# Patient Record
Sex: Female | Born: 1980 | Race: Black or African American | Hispanic: No | Marital: Single | Smoking: Current every day smoker
Health system: Southern US, Community
[De-identification: ages and names within clinical notes are randomized; demographics above are authoritative.]

## PROBLEM LIST (undated history)

## (undated) ENCOUNTER — Emergency Department (HOSPITAL_COMMUNITY): Admission: EM | Payer: Medicaid Other | Source: Home / Self Care

---

## 2006-07-21 ENCOUNTER — Emergency Department (HOSPITAL_COMMUNITY): Admission: EM | Admit: 2006-07-21 | Discharge: 2006-07-21 | Payer: Self-pay | Admitting: *Deleted

## 2006-11-06 ENCOUNTER — Ambulatory Visit (HOSPITAL_COMMUNITY): Admission: RE | Admit: 2006-11-06 | Discharge: 2006-11-06 | Payer: Self-pay | Admitting: Obstetrics

## 2007-03-11 HISTORY — PX: TUBAL LIGATION: SHX77

## 2007-05-12 ENCOUNTER — Ambulatory Visit (HOSPITAL_COMMUNITY): Admission: RE | Admit: 2007-05-12 | Discharge: 2007-05-12 | Payer: Self-pay | Admitting: Obstetrics

## 2007-08-10 ENCOUNTER — Emergency Department (HOSPITAL_COMMUNITY): Admission: EM | Admit: 2007-08-10 | Discharge: 2007-08-10 | Payer: Self-pay | Admitting: Family Medicine

## 2007-09-28 ENCOUNTER — Inpatient Hospital Stay (HOSPITAL_COMMUNITY): Admission: AD | Admit: 2007-09-28 | Discharge: 2007-09-28 | Payer: Self-pay | Admitting: Obstetrics

## 2007-10-09 ENCOUNTER — Inpatient Hospital Stay (HOSPITAL_COMMUNITY): Admission: AD | Admit: 2007-10-09 | Discharge: 2007-10-11 | Payer: Self-pay | Admitting: Obstetrics

## 2007-10-10 ENCOUNTER — Encounter: Payer: Self-pay | Admitting: Obstetrics

## 2007-10-24 ENCOUNTER — Emergency Department (HOSPITAL_COMMUNITY): Admission: EM | Admit: 2007-10-24 | Discharge: 2007-10-24 | Payer: Self-pay | Admitting: Family Medicine

## 2009-06-20 ENCOUNTER — Emergency Department (HOSPITAL_COMMUNITY): Admission: EM | Admit: 2009-06-20 | Discharge: 2009-06-20 | Payer: Self-pay | Admitting: Family Medicine

## 2010-07-23 NOTE — Op Note (Signed)
Karina Fritz, Karina Fritz            ACCOUNT NO.:  [id number]   MEDICAL RECORD NO.:  [id number]          PATIENT TYPE:  INP   LOCATION:  9120                          FACILITY:  [facility]   PHYSICIAN:  Charles A. Clearance Coots, M.D.DATE OF BIRTH:  [birth year]   DATE OF PROCEDURE:  10/10/2007  DATE OF DISCHARGE:                               OPERATIVE REPORT   PREOPERATIVE DIAGNOSIS:  Desires sterilization.   POSTOPERATIVE DIAGNOSIS:  Desires sterilization.   PROCEDURE:  Bilateral partial salpingectomy.   SURGEON:  Charles A. Clearance Coots, MD   ANESTHESIA:  General.   ESTIMATED BLOOD LOSS:  Negligible.   COMPLICATIONS:  None.   SPECIMEN:  Approximately 2 cm segments of right and left fallopian  tubes.   DISPOSITION OF SPECIMEN:  Pathology.   OPERATION:  The patient was brought to the operating room and after  satisfactory general endotracheal anesthesia, the abdomen was prepped  and draped in the usual sterile fashion.  The urinary bladder was  emptied of approximately 200 mL of clear urine.  A small inferior  umbilical incision was made with a scalpel down to the fascia.  The  fascia was grasped in the midline with the Kelly forceps and was cut  transversely with curved Mayo scissors.  The fascial incision was  extended to left and to right with the curved Mayo scissors.  The  peritoneum was then entered bluntly with hemostats.  Right-angle  retractors were placed in the peritoneal opening.  The right fallopian  tube was identified and was grasped with a Babcock clamp.  The knuckle  of tube beneath the Babcock clamp was identified from the corneal end to  the fimbrial end and grasped with a Babcock clamp.  Knuckle of tube  beneath the Babcock clamp in the isthmic area of the tube was then  suture ligated through the mesosalpinx with 0 plain catgut.  The knuckle  of tube above the knot was excised with Metzenbaum scissors and  submitted to pathology for evaluation.  There was no active bleeding  at  the conclusion of the procedure.  Same procedure was performed on the  opposite side without complications.  The abdomen was then closed as  follows; the peritoneum and the fascia was closed as one with a  continuous suture of 2-0 Vicryl.  Skin was closed with a continuous  subcuticular suture of  4-0 Vicryl.  Sterile bandage was applied to the incision closure.  Surgical technician indicated that all needle, sponge, and instrument  counts were correct x2.  The patient tolerated the procedure well and  was transported to the recovery room in satisfactory condition.      Charles A. Clearance Coots, M.D.  Electronically Signed     CAH/MEDQ  D:  10/10/2007  T:  10/10/2007  Job:  [id number]

## 2010-12-06 LAB — CBC
HCT: 29.5 — ABNORMAL LOW
Hemoglobin: 9 — ABNORMAL LOW
Hemoglobin: 9.6 — ABNORMAL LOW
MCHC: 31.9
Platelets: 249
RBC: 3.67 — ABNORMAL LOW
WBC: 10.5

## 2015-05-15 ENCOUNTER — Emergency Department (HOSPITAL_COMMUNITY)
Admission: EM | Admit: 2015-05-15 | Discharge: 2015-05-15 | Disposition: A | Discharge status: Home / Self Care | Payer: Medicaid Other | Source: Home / Self Care | Attending: Family Medicine | Admitting: Family Medicine

## 2015-05-15 ENCOUNTER — Encounter (HOSPITAL_COMMUNITY): Payer: Self-pay | Admitting: Emergency Medicine

## 2015-05-15 DIAGNOSIS — S0502XA Injury of conjunctiva and corneal abrasion without foreign body, left eye, initial encounter: Secondary | ICD-10-CM

## 2015-05-15 MED ORDER — FLUORESCEIN SODIUM 1 MG OP STRP
ORAL_STRIP | OPHTHALMIC | Status: AC
  Filled 2015-05-15: qty 1

## 2015-05-15 MED ORDER — HYDROCODONE-ACETAMINOPHEN 5-325 MG PO TABS
1.0000 | ORAL_TABLET | Freq: Once | ORAL | Status: AC
  Administered 2015-05-15: 1 via ORAL

## 2015-05-15 MED ORDER — TOBRAMYCIN 0.3 % OP SOLN: 1.0000 [drp] | OPHTHALMIC | Status: DC

## 2015-05-15 MED ORDER — TEMAZEPAM 30 MG PO CAPS: 30.0000 mg | ORAL_CAPSULE | Freq: Every evening | ORAL | Status: DC | PRN

## 2015-05-15 MED ORDER — HYDROCODONE-ACETAMINOPHEN 5-325 MG PO TABS
ORAL_TABLET | ORAL | Status: AC
  Filled 2015-05-15: qty 1

## 2015-05-15 MED ORDER — TETRACAINE HCL 0.5 % OP SOLN
OPHTHALMIC | Status: AC
  Filled 2015-05-15: qty 2

## 2015-05-15 NOTE — Discharge Instructions (Signed)

## 2015-05-15 NOTE — ED Provider Notes (Signed)
CSN: [id number]     Arrival date & time 05/15/15  1820 History   First MD Initiated Contact with Patient 05/15/15 1944     Chief Complaint  Patient presents with  . Facial Swelling   (Consider location/radiation/quality/duration/timing/severity/associated sxs/prior Treatment) HPI Comments: 35 year old female complaining of pain and swelling to the left eye. This started approximately 4 days ago when she had itching to the left eye and she continued to rub it. The following day he was mildly swollen, irritated and red. The ensuing couple of days the swelling persisted in the discomfort pain increased. She has occasional light clear watery drainage from the left eye. Denies vision changes.   History reviewed. No pertinent past medical history. History reviewed. No pertinent past surgical history. No family history on file. Social History  Substance Use Topics  . Smoking status: Current Every Day Smoker  . Smokeless tobacco: None  . Alcohol Use: Yes     Comment: ocassionally   OB History    No data available     Review of Systems  Constitutional: Negative.   HENT: Negative.   Eyes: Positive for pain, redness and itching. Negative for discharge and visual disturbance.  Respiratory: Negative.   Neurological: Negative.     Allergies  Review of patient's allergies indicates no known allergies.  Home Medications   Prior to Admission medications   Medication Sig Start Date End Date Taking? Authorizing Provider  temazepam (RESTORIL) 30 MG capsule Take 1 capsule (30 mg total) by mouth at bedtime as needed for sleep. 05/15/15   Hayden Rasmussenavid Kalee Mcclenathan, NP  tobramycin (TOBREX) 0.3 % ophthalmic solution Place 1 drop into the left eye every 4 (four) hours. X 5 days 05/15/15   Hayden Rasmussenavid Shonta Phillis, NP   Meds Ordered and Administered this Visit   Medications  HYDROcodone-acetaminophen (NORCO/VICODIN) 5-325 MG per tablet 1 tablet (not administered)    BP 118/79 mmHg  Pulse 78  Temp(Src) 98.6 F (37 C) (Oral)   Resp 16  SpO2 100%  LMP 05/01/2015 No data found.   Physical Exam  Constitutional: She is oriented to person, place, and time. She appears well-developed and well-nourished. No distress.  Eyes: EOM are normal. Pupils are equal, round, and reactive to light.  Conjunctival erythema and a lower lid swelling. Minor scleral injection. Anterior chamber is clear. EOMI intact. After application of tetracaine and forcing the eye was examined under blue light and there is a semi-circular uptake to the inferior cornea. No foreign body seen. No other defects seen. Left eye began irrigated with eyewash.  Neck: Normal range of motion. Neck supple.  Cardiovascular: Normal rate.   Pulmonary/Chest: Effort normal. No respiratory distress.  Musculoskeletal: She exhibits no edema.  Neurological: She is alert and oriented to person, place, and time. She exhibits normal muscle tone.  Skin: Skin is warm and dry.  Psychiatric: She has a normal mood and affect.  Nursing note and vitals reviewed.   ED Course  Procedures (including critical care time)  Labs Review Labs Reviewed - No data to display  Imaging Review No results found.   Visual Acuity Review  Right Eye Distance:   Left Eye Distance:   Bilateral Distance:    Right Eye Near:   Left Eye Near:    Bilateral Near:         MDM   1. Corneal abrasion, left, initial encounter    Meds ordered this encounter  Medications  . tobramycin (TOBREX) 0.3 % ophthalmic solution    Sig:  Place 1 drop into the left eye every 4 (four) hours. X 5 days    Dispense:  5 mL    Refill:  0    Order Specific Question:  Supervising Provider    Answer:  Linna Hoff [phone]  . HYDROcodone-acetaminophen (NORCO/VICODIN) 5-325 MG per tablet 1 tablet    Sig:   . temazepam (RESTORIL) 30 MG capsule    Sig: Take 1 capsule (30 mg total) by mouth at bedtime as needed for sleep.    Dispense:  1 capsule    Refill:  0    Order Specific Question:  Supervising  Provider    Answer:  Bradd Canary D [[id number]]   F/U with opthal tomorrow or following day if not improving or worse.    Hayden Rasmussen, NP 05/15/15 2036

## 2015-05-15 NOTE — ED Notes (Signed)
Pt here with left eye redness and swelling that started Saturday Denies drainage, blurred vision or dizziness Tried eye drops but not getting better

## 2015-05-16 ENCOUNTER — Telehealth (HOSPITAL_COMMUNITY): Payer: Self-pay | Admitting: Emergency Medicine

## 2015-05-16 NOTE — ED Notes (Signed)
Accessed record for patient phone call 

## 2015-07-18 ENCOUNTER — Emergency Department (HOSPITAL_COMMUNITY)
Admission: EM | Admit: 2015-07-18 | Discharge: 2015-07-18 | Disposition: A | Discharge status: Home / Self Care | Payer: Medicaid Other | Attending: Emergency Medicine | Admitting: Emergency Medicine

## 2015-07-18 ENCOUNTER — Encounter (HOSPITAL_COMMUNITY): Payer: Self-pay | Admitting: Nurse Practitioner

## 2015-07-18 DIAGNOSIS — N390 Urinary tract infection, site not specified: Secondary | ICD-10-CM | POA: Diagnosis not present

## 2015-07-18 DIAGNOSIS — R350 Frequency of micturition: Secondary | ICD-10-CM | POA: Diagnosis present

## 2015-07-18 DIAGNOSIS — F17200 Nicotine dependence, unspecified, uncomplicated: Secondary | ICD-10-CM | POA: Insufficient documentation

## 2015-07-18 DIAGNOSIS — Z3202 Encounter for pregnancy test, result negative: Secondary | ICD-10-CM | POA: Insufficient documentation

## 2015-07-18 DIAGNOSIS — R319 Hematuria, unspecified: Secondary | ICD-10-CM

## 2015-07-18 LAB — URINE MICROSCOPIC-ADD ON

## 2015-07-18 LAB — URINALYSIS, ROUTINE W REFLEX MICROSCOPIC
GLUCOSE, UA: NEGATIVE mg/dL
KETONES UR: 15 mg/dL — AB
Nitrite: POSITIVE — AB
PROTEIN: 30 mg/dL — AB
Specific Gravity, Urine: 1.023 (ref 1.005–1.030)
pH: 5.5 (ref 5.0–8.0)

## 2015-07-18 LAB — POC URINE PREG, ED: Preg Test, Ur: NEGATIVE

## 2015-07-18 MED ORDER — CEPHALEXIN 500 MG PO CAPS: 500.0000 mg | ORAL_CAPSULE | Freq: Four times a day (QID) | ORAL | Status: DC

## 2015-07-18 NOTE — ED Notes (Addendum)
Pt reports 1 week history of increased urinary frequency. Then Monday she began to have dysuria, urinary hesitancy, hematuria. She has been taking AZO since Monday with no improvement. She denies fevers, chills, n/v. She is alert and breathing easily

## 2015-07-18 NOTE — ED Provider Notes (Signed)
CSN: [id number]     Arrival date & time 07/18/15  1502 History  By signing my name below, I, Karina Fritz, attest that this documentation has been prepared under the direction and in the presence of Karina Fritz, [place]. Electronically Signed: Angelene GiovanniEmmanuella Fritz, ED Scribe. 07/18/2015. 4:25 PM.    Chief Complaint  Patient presents with  . Urinary Frequency   The history is provided by the patient. No language interpreter was used.   HPI Comments: Karina Fritz is a 35 y.o. female who presents to the Emergency Department complaining of gradually worsening moderate urinary frequency onset 2 days ago. Pt reports associated dysuria, hematuria, back pain, and suprapubic pressure. No alleviating factors noted. She states that she has tried OTC AZO with no significant relief. Pt denies any hx of UTI. She denies any fever, chills, or n/v.    History reviewed. No pertinent past medical history. History reviewed. No pertinent past surgical history. History reviewed. No pertinent family history. Social History  Substance Use Topics  . Smoking status: Current Every Day Smoker  . Smokeless tobacco: None  . Alcohol Use: Yes     Comment: ocassionally   OB History    No data available     Review of Systems  Constitutional: Negative for fever and chills.  Gastrointestinal: Positive for abdominal pain. Negative for nausea and vomiting.  Genitourinary: Positive for dysuria, frequency and hematuria.  Musculoskeletal: Positive for back pain.  All other systems reviewed and are negative.     Allergies  Review of patient's allergies indicates no known allergies.  Home Medications   Prior to Admission medications   Medication Sig Start Date End Date Taking? Authorizing Provider  temazepam (RESTORIL) 30 MG capsule Take 1 capsule (30 mg total) by mouth at bedtime as needed for sleep. 05/15/15   Hayden Rasmussenavid Mabe, NP  tobramycin (TOBREX) 0.3 % ophthalmic solution Place 1 drop into the left eye every 4  (four) hours. X 5 days 05/15/15   Hayden Rasmussenavid Mabe, NP   BP 104/57 mmHg  Pulse 82  Temp(Src) 99.2 F (37.3 C) (Oral)  Resp 18  Ht 4\' 11"  (1.499 m)  Wt 125 lb (56.7 kg)  BMI 25.23 kg/m2  SpO2 100%  LMP 06/10/2015 Physical Exam  Constitutional: She is oriented to person, place, and time. She appears well-developed and well-nourished.  HENT:  Head: Normocephalic and atraumatic.  Eyes: Conjunctivae are normal.  Neck: Neck supple.  Cardiovascular: Normal rate.   Pulmonary/Chest: Effort normal.  Abdominal: Soft. Bowel sounds are normal. She exhibits no distension. There is no tenderness.  No CVA tenderness  Neurological: She is alert and oriented to person, place, and time.  Skin: Skin is warm and dry.  Psychiatric: She has a normal mood and affect.  Nursing note and vitals reviewed.   ED Course  Procedures (including critical care time) DIAGNOSTIC STUDIES: Oxygen Saturation is 100% on RA, normal by my interpretation.    COORDINATION OF CARE: 4:24 PM- Pt advised of plan for treatment and pt agrees. Pt will receive UA for further evaluation.    Labs Review Labs Reviewed  URINALYSIS, ROUTINE W REFLEX MICROSCOPIC (NOT AT [facility]) - Abnormal; Notable for the following:    Color, Urine AMBER (*)    APPearance HAZY (*)    Hgb urine dipstick LARGE (*)    Bilirubin Urine SMALL (*)    Ketones, ur 15 (*)    Protein, ur 30 (*)    Nitrite POSITIVE (*)    Leukocytes, UA MODERATE (*)  All other components within normal limits  URINE MICROSCOPIC-ADD ON - Abnormal; Notable for the following:    Squamous Epithelial / LPF 6-30 (*)    Bacteria, UA FEW (*)    All other components within normal limits  POC URINE PREG, ED    Imaging Review No results found.   Noelle Penner, PA-C has personally reviewed and evaluated these images and lab results as part of her medical decision-making.   MDM   Final diagnoses:  Urinary tract infection with hematuria, site unspecified    UA does show evidence  of contamination. However, it is nitrite positive with 6-30 WBC, TNTC hgb. Given symptoms will treat as UTI. Urine sent for culture. Started on keflex. Encouraged continuing Azo as needed for dysuria. ER return precautions given.   I personally performed the services described in this documentation, which was scribed in my presence. The recorded information has been reviewed and is accurate.   Carlene Coria, PA-C 07/18/15 1912  Pricilla Loveless, MD 07/18/15 2352

## 2015-07-18 NOTE — Discharge Instructions (Signed)
Your urine today showed evidence of infection. I will give you a prescription for antibiotics to take for one week. Please make sure to finish the entire bottle as prescribed. You may continue taking Azo to help with the pain if you would like. Return to the ER for new or worsening symptoms.   Urinary Tract Infection Urinary tract infections (UTIs) can develop anywhere along your urinary tract. Your urinary tract is your body's drainage system for removing wastes and extra water. Your urinary tract includes two kidneys, two ureters, a bladder, and a urethra. Your kidneys are a pair of bean-shaped organs. Each kidney is about the size of your fist. They are located below your ribs, one on each side of your spine. CAUSES Infections are caused by microbes, which are microscopic organisms, including fungi, viruses, and bacteria. These organisms are so small that they can only be seen through a microscope. Bacteria are the microbes that most commonly cause UTIs. SYMPTOMS  Symptoms of UTIs may vary by age and gender of the patient and by the location of the infection. Symptoms in young women typically include a frequent and intense urge to urinate and a painful, burning feeling in the bladder or urethra during urination. Older women and men are more likely to be tired, shaky, and weak and have muscle aches and abdominal pain. A fever may mean the infection is in your kidneys. Other symptoms of a kidney infection include pain in your back or sides below the ribs, nausea, and vomiting. DIAGNOSIS To diagnose a UTI, your caregiver will ask you about your symptoms. Your caregiver will also ask you to provide a urine sample. The urine sample will be tested for bacteria and white blood cells. White blood cells are made by your body to help fight infection. TREATMENT  Typically, UTIs can be treated with medication. Because most UTIs are caused by a bacterial infection, they usually can be treated with the use of  antibiotics. The choice of antibiotic and length of treatment depend on your symptoms and the type of bacteria causing your infection. HOME CARE INSTRUCTIONS  If you were prescribed antibiotics, take them exactly as your caregiver instructs you. Finish the medication even if you feel better after you have only taken some of the medication.  Drink enough water and fluids to keep your urine clear or pale yellow.  Avoid caffeine, tea, and carbonated beverages. They tend to irritate your bladder.  Empty your bladder often. Avoid holding urine for long periods of time.  Empty your bladder before and after sexual intercourse.  After a bowel movement, women should cleanse from front to back. Use each tissue only once. SEEK MEDICAL CARE IF:   You have back pain.  You develop a fever.  Your symptoms do not begin to resolve within 3 days. SEEK IMMEDIATE MEDICAL CARE IF:   You have severe back pain or lower abdominal pain.  You develop chills.  You have nausea or vomiting.  You have continued burning or discomfort with urination. MAKE SURE YOU:   Understand these instructions.  Will watch your condition.  Will get help right away if you are not doing well or get worse.   This information is not intended to replace advice given to you by your health care provider. Make sure you discuss any questions you have with your health care provider.   Document Released: 12/04/2004 Document Revised: 11/15/2014 Document Reviewed: 04/04/2011 Elsevier Interactive Patient Education [employer]2016 Elsevier Inc.

## 2016-04-02 ENCOUNTER — Emergency Department (HOSPITAL_COMMUNITY)
Admission: EM | Admit: 2016-04-02 | Discharge: 2016-04-02 | Disposition: A | Discharge status: Home / Self Care | Payer: Medicaid Other | Attending: Emergency Medicine | Admitting: Emergency Medicine

## 2016-04-02 ENCOUNTER — Encounter (HOSPITAL_COMMUNITY): Payer: Self-pay

## 2016-04-02 DIAGNOSIS — F17200 Nicotine dependence, unspecified, uncomplicated: Secondary | ICD-10-CM | POA: Insufficient documentation

## 2016-04-02 DIAGNOSIS — N3001 Acute cystitis with hematuria: Secondary | ICD-10-CM | POA: Insufficient documentation

## 2016-04-02 LAB — URINALYSIS, ROUTINE W REFLEX MICROSCOPIC
BILIRUBIN URINE: NEGATIVE
Glucose, UA: NEGATIVE mg/dL
KETONES UR: NEGATIVE mg/dL
Nitrite: POSITIVE — AB
PH: 5 (ref 5.0–8.0)
Protein, ur: 100 mg/dL — AB
Specific Gravity, Urine: 1.012 (ref 1.005–1.030)

## 2016-04-02 LAB — POC URINE PREG, ED: PREG TEST UR: NEGATIVE

## 2016-04-02 MED ORDER — FOSFOMYCIN TROMETHAMINE 3 G PO PACK
3.0000 g | PACK | Freq: Once | ORAL | Status: AC
  Administered 2016-04-02: 3 g via ORAL
  Filled 2016-04-02: qty 3

## 2016-04-02 MED ORDER — PHENAZOPYRIDINE HCL 200 MG PO TABS: 200.0000 mg | ORAL_TABLET | Freq: Three times a day (TID) | ORAL | 0 refills | Status: DC

## 2016-04-02 NOTE — ED Provider Notes (Signed)
[facility]-EMERGENCY DEPT Provider Note   CSN: [id number] Arrival date & time: 04/02/16  [id number]  By signing my name below, I, Modena JanskyAlbert Thayil, attest that this documentation has been prepared under the direction and in the presence of non-physician practitioner, Sharilyn SitesLisa Arloa Prak, PA-C. Electronically Signed: Modena JanskyAlbert Thayil, Scribe. 04/02/2016. 9:50 AM.  History   Chief Complaint Chief Complaint  Patient presents with  . Dysuria   The history is provided by the patient. No language interpreter was used.   HPI Comments: Karina Blanksomeka Fritz is a 36 y.o. female with a PMHx of UTI who presents to the Emergency Department complaining of intermittent moderate dysuria that started yesterday morning. She states she has been having a burning sensation with urination. She describes the current episode as similar to prior UTI. No modifying factors. She reports associated urinary symptoms of frequency and pressure. She denies any fever, vomiting, or other complaints. No vaginal discharge or pelvic pain. No flank pain or fever.  History reviewed. No pertinent past medical history.  There are no active problems to display for this patient.   History reviewed. No pertinent surgical history.  OB History    No data available       Home Medications    Prior to Admission medications   Medication Sig Start Date End Date Taking? Authorizing Provider  cephALEXin (KEFLEX) 500 MG capsule Take 1 capsule (500 mg total) by mouth 4 (four) times daily. 07/18/15   Ace GinsSerena Y Sam, PA-C  temazepam (RESTORIL) 30 MG capsule Take 1 capsule (30 mg total) by mouth at bedtime as needed for sleep. 05/15/15   Hayden Rasmussenavid Mabe, NP  tobramycin (TOBREX) 0.3 % ophthalmic solution Place 1 drop into the left eye every 4 (four) hours. X 5 days 05/15/15   Hayden Rasmussenavid Mabe, NP    Family History No family history on file.  Social History Social History  Substance Use Topics  . Smoking status: Current Every Day Smoker  . Smokeless tobacco: Not on file  .  Alcohol use Yes     Comment: ocassionally     Allergies   Patient has no known allergies.   Review of Systems Review of Systems  Constitutional: Negative for fever.  Gastrointestinal: Negative for vomiting.  Genitourinary: Positive for dysuria and frequency.       +pressure  All other systems reviewed and are negative.    Physical Exam Updated Vital Signs BP (!) 101/52   Pulse 78   Temp 98.9 F (37.2 C) (Oral)   Resp 18   SpO2 97%   Physical Exam  Constitutional: She is oriented to person, place, and time. She appears well-developed and well-nourished.  HENT:  Head: Normocephalic and atraumatic.  Mouth/Throat: Oropharynx is clear and moist.  Eyes: Conjunctivae and EOM are normal. Pupils are equal, round, and reactive to light.  Neck: Normal range of motion.  Cardiovascular: Normal rate, regular rhythm and normal heart sounds.   Pulmonary/Chest: Effort normal and breath sounds normal.  Abdominal: Soft. Bowel sounds are normal. There is no tenderness. There is no rebound and no CVA tenderness.  Soft, benign, no CVA tenderness  Musculoskeletal: Normal range of motion.  Neurological: She is alert and oriented to person, place, and time.  Skin: Skin is warm and dry.  Psychiatric: She has a normal mood and affect.  Nursing note and vitals reviewed.    ED Treatments / Results  DIAGNOSTIC STUDIES: Oxygen Saturation is 97% on RA, normal by my interpretation.    COORDINATION OF CARE: 9:54 AM- Pt  advised of plan for treatment and pt agrees.  Labs (all labs ordered are listed, but only abnormal results are displayed) Labs Reviewed  URINALYSIS, ROUTINE W REFLEX MICROSCOPIC - Abnormal; Notable for the following:       Result Value   Color, Urine AMBER (*)    APPearance HAZY (*)    Hgb urine dipstick MODERATE (*)    Protein, ur 100 (*)    Nitrite POSITIVE (*)    Leukocytes, UA TRACE (*)    Bacteria, UA RARE (*)    Squamous Epithelial / LPF 0-5 (*)    Non Squamous  Epithelial 0-5 (*)    All other components within normal limits  POC URINE PREG, ED    EKG  EKG Interpretation None       Radiology No results found.  Procedures Procedures (including critical care time)  Medications Ordered in ED Medications - No data to display   Initial Impression / Assessment and Plan / ED Course  I have reviewed the triage vital signs and the nursing notes.  Pertinent labs & imaging results that were available during my care of the patient were reviewed by me and considered in my medical decision making (see chart for details).  36 year old female here with dysuria and urinary frequency. History of UTI in the past with similar symptoms. She is afebrile and nontoxic. Abdomen is soft and benign. No CVA tenderness. UA appears infectious. No signs/symptoms concerning for pyelonephritis or infected stone.  She was treated with one-time dose of fosfomycin here, discharged home with Pyridium. Recommended to follow-up with PCP.  Discussed plan with patient, he/she acknowledged understanding and agreed with plan of care.  Return precautions given for new or worsening symptoms.  Final Clinical Impressions(s) / ED Diagnoses   Final diagnoses:  Acute cystitis with hematuria    New Prescriptions Discharge Medication List as of 04/02/2016 11:34 AM    START taking these medications   Details  phenazopyridine (PYRIDIUM) 200 MG tablet Take 1 tablet (200 mg total) by mouth 3 (three) times daily., Starting Wed 04/02/2016, Print       I personally performed the services described in this documentation, which was scribed in my presence. The recorded information has been reviewed and is accurate.    Garlon Hatchet, PA-C 04/02/16 1227    Gerhard Munch, MD 04/02/16 [phone]

## 2016-04-02 NOTE — ED Notes (Signed)
Pt is in stable condition upon d/c and ambulates from ED. 

## 2016-04-02 NOTE — Discharge Instructions (Signed)
You have been treated for UTI here. Take the prescribed medication as directed.  This will turn your urine red/orange which is normal. Follow-up with your primary care doctor. Return to the ED for new or worsening symptoms.

## 2016-04-02 NOTE — ED Notes (Signed)
Called mini lab to see why results haven't posted. Karina Fritz states they never received sample.

## 2016-04-02 NOTE — ED Triage Notes (Signed)
Patient complains of dysuria and urinary frequency x 1 day, hx of same. Denies pelvic pain, denies discharge

## 2017-05-04 ENCOUNTER — Ambulatory Visit: Payer: Self-pay | Admitting: Internal Medicine

## 2017-05-29 ENCOUNTER — Encounter: Payer: Self-pay | Admitting: Internal Medicine

## 2017-05-29 ENCOUNTER — Ambulatory Visit: Payer: Medicaid Other | Admitting: Internal Medicine

## 2017-05-29 VITALS — BP 100/64 | HR 74 | Resp 12 | Ht <= 58 in | Wt 141.5 lb

## 2017-05-29 DIAGNOSIS — Z716 Tobacco abuse counseling: Secondary | ICD-10-CM | POA: Diagnosis not present

## 2017-05-29 DIAGNOSIS — H547 Unspecified visual loss: Secondary | ICD-10-CM | POA: Diagnosis not present

## 2017-05-29 DIAGNOSIS — Z9189 Other specified personal risk factors, not elsewhere classified: Secondary | ICD-10-CM | POA: Diagnosis not present

## 2017-05-29 DIAGNOSIS — H6123 Impacted cerumen, bilateral: Secondary | ICD-10-CM | POA: Diagnosis not present

## 2017-05-29 NOTE — Progress Notes (Signed)
Subjective:    Patient ID: Karina Fritz, female    DOB: Jul 28, 1980, 37 y.o.   MRN: [id number]  HPI   Here to establish  1.  Dental concerns:  Just has not been to dentist in a long time.  Really just wants attention to her teeth.  2. Wears glasses for myopia.  Last check was 1 year ago and does not feel her glasses are correcting her vision adequately.  3.  Tobacco Use:  Has only tried to quit cold [place]. Stress draws her back.  No one else smokes in the home.  Many of friends smokes.   Children, ages 75, 69, 81 yo live with her and none of them smoke.  Current Meds  Medication Sig  . Biotin 5000 MCG CAPS Take by mouth.    No Known Allergies   No past medical history on file.   Past Surgical History:  Procedure Laterality Date  . TUBAL LIGATION  2009   Family History  Problem Relation Age of Onset  . Hyperlipidemia Mother   . Alcohol abuse Father        Alcoholic cirrhosis was cause of death  . Cerebral palsy Son        spastic hemiplegia, just affecting right leg   Social History   Socioeconomic History  . Marital status: Single    Spouse name: Not on file  . Number of children: Not on file  . Years of education: Not on file  . Highest education level: Not on file  Occupational History  . Not on file  Social Needs  . Financial resource strain: Not on file  . Food insecurity:    Worry: Never true    Inability: Never true  . Transportation needs:    Medical: No    Non-medical: No  Tobacco Use  . Smoking status: Current Every Day Smoker    Packs/day: 1.00    Years: 25.00    Pack years: 25.00    Types: Cigarettes    Start date: 09/28/1992  . Smokeless tobacco: Never Used  Substance and Sexual Activity  . Alcohol use: Yes    Comment: occasionally on weekends  . Drug use: No  . Sexual activity: Yes    Birth control/protection: Surgical  Lifestyle  . Physical activity:    Days per week: 7 days    Minutes per session: 20 min  . Stress: Very much    Relationships  . Social connections:    Talks on phone: More than three times a week    Gets together: More than three times a week    Attends religious service: Not on file    Active member of club or organization: Not on file    Attends meetings of clubs or organizations: Not on file    Relationship status: Not on file  . Intimate partner violence:    Fear of current or ex partner: No    Emotionally abused: No    Physically abused: No    Forced sexual activity: No  Other Topics Concern  . Not on file  Social History Narrative  . Not on file     Review of Systems     Objective:   Physical Exam  NAD HEENT:  PERRL, EOMI, discs sharp bilaterally.  TMs not visualized as has bilateral dark impacted cerumen.  Throat without injection.  No significant dental decay. Neck:  Supple, No adenopathy, no thyromegaly Chest:  CTA CV:  RRR with Normal S1 and  S2, No S3, S4 or murmur.  Radial and DP pulses normal and equal. Hot pink uggs      Assessment & Plan:  1.  Dental care:  Dental referral, but explained a delay currently as renovation ongoing with dental clinic.  2.  Myopia:  Optometry referral, but similar concerns for delayed referral as clinic merging.  3.  Tobacco abuse:  Discussed options at length. Patient to let me know if wants to do more than nicotine gum or patches.  Discussed Chantix/Wellbutrin options as well.  4.  Cerumen impaction:  To return next available for removal.  Discussed ear drops to soften prior.  5.  HM:  CPE with pap after follows up for fasting labs in 3-4 months.

## 2017-05-29 NOTE — Patient Instructions (Addendum)
Can google "advance directives, [place]"  And bring up form from [place]. Print and fill out Or can go to "5 wishes"  Which is also in Spanish and fill out--this costs $5--perhaps easier to use. Designate a Medical Power of Attorney to speak for you if you are unable to speak for yourself when ill or injured  Tobacco Cessation:   1800QUITNOW or [phone], the former for support and possibly free nicotine patches/gum and support; the latter for [facility] Smoking cessation class. Get rid of all smoking supplies:  Cigarettes, lighters, ashtrays--no stashes just in case at home if you are serious.  For Nicotine gum:  When you feel need for smoking:  Place nicotine gum in mouth and chew until juicy, then stick between gum and cheek.  You will absorb the nicotine from your mouth.   Do not aggressively chew gum. Spit gum out in garbage once you feel you are done with it.  For nicotine patches:  Stop smoking anything the day you start the first patch Start with 21 mg patch and reapply new to different area of skin every 24 hours for 28 days. Then 14 mg patch changed every 24 hours for 14 days. Then 7 mg patch changed every 24 hours for 14 days.  Also, we talked about Chantix and Wellbutrin/Bupropion which are pills for smoking cessation

## 2017-05-29 NOTE — Progress Notes (Signed)
Patient ID: Karina Fritz, female   DOB: 02/24/1981, 37 y.o.   MRN: [id number]  LCSW completed a new patient screener with pt. Patient reported that she feels depressed fairly often and that she has big mood swings -- "I go from 0 to 100 in a second." She reported that she has daily fatigue and stress. She reported no issues with basic needs/social determinants of health. She shared that she is interested in counseling to address her mental health symptoms but wants to think about it. LCSW normalized treating mental health and physical health together in a holistic approach, and left contact number.

## 2017-07-04 ENCOUNTER — Encounter: Payer: Self-pay | Admitting: Internal Medicine

## 2017-08-11 ENCOUNTER — Other Ambulatory Visit: Payer: Medicaid Other

## 2017-08-14 ENCOUNTER — Encounter: Payer: Medicaid Other | Admitting: Internal Medicine

## 2018-08-02 ENCOUNTER — Emergency Department (HOSPITAL_COMMUNITY)
Admission: EM | Admit: 2018-08-02 | Discharge: 2018-08-02 | Disposition: A | Discharge status: Home / Self Care | Payer: Self-pay | Attending: Emergency Medicine | Admitting: Emergency Medicine

## 2018-08-02 ENCOUNTER — Other Ambulatory Visit: Payer: Self-pay

## 2018-08-02 ENCOUNTER — Encounter (HOSPITAL_COMMUNITY): Payer: Self-pay | Admitting: Emergency Medicine

## 2018-08-02 DIAGNOSIS — F17210 Nicotine dependence, cigarettes, uncomplicated: Secondary | ICD-10-CM | POA: Insufficient documentation

## 2018-08-02 DIAGNOSIS — R21 Rash and other nonspecific skin eruption: Secondary | ICD-10-CM | POA: Insufficient documentation

## 2018-08-02 MED ORDER — CLOTRIMAZOLE 1 % EX CREA: TOPICAL_CREAM | CUTANEOUS | 0 refills | Status: DC

## 2018-08-02 NOTE — ED Triage Notes (Signed)
Pt c/o rash that started last week. Reports rash keeps spreading even after using a cream on it. reports itches. Thinks it is ringworm  [employer].

## 2018-08-02 NOTE — Discharge Instructions (Signed)
Please call a dermatologist for follow up °

## 2018-08-08 NOTE — ED Provider Notes (Signed)
[facility]-EMERGENCY DEPT Provider Note   CSN: [id number] Arrival date & time: 08/02/18  1029    History   Chief Complaint Chief Complaint  Patient presents with  . Rash    HPI Karina Blanksomeka Cardy is a 38 y.o. female.     HPI 38 year old female presents the emergency department with continued rash over the past several weeks despite miconazole cream.  She believes it could be secondary to ringworm.  She has not seen a primary care physician or dermatologist.  She presents the emergency department because she states it continues to move around her body and she is concerned.  No difficulty breathing or swallowing.  No fevers or chills.  No other systemic complaints.  Symptoms are mild in severity.  Denies new lotions or soaps or detergents.  No history of eczema or asthma per the patient   History reviewed. No pertinent past medical history.  There are no active problems to display for this patient.   Past Surgical History:  Procedure Laterality Date  . TUBAL LIGATION  2009     OB History   No obstetric history on file.      Home Medications    Prior to Admission medications   Medication Sig Start Date End Date Taking? Authorizing Provider  Biotin 5000 MCG CAPS Take by mouth.    [provider]  clotrimazole (LOTRIMIN) 1 % cream Apply to affected area 2 times daily 08/02/18   Azalia Bilisampos, Dejean Tribby, MD  temazepam (RESTORIL) 30 MG capsule Take 1 capsule (30 mg total) by mouth at bedtime as needed for sleep. Patient not taking: Reported on 05/29/2017 05/15/15   Hayden RasmussenMabe, David, NP  tobramycin (TOBREX) 0.3 % ophthalmic solution Place 1 drop into the left eye every 4 (four) hours. X 5 days Patient not taking: Reported on 05/29/2017 05/15/15   Hayden RasmussenMabe, David, NP    Family History Family History  Problem Relation Age of Onset  . Hyperlipidemia Mother   . Alcohol abuse Father        Alcoholic cirrhosis was cause of death  . Cerebral palsy Son        spastic  hemiplegia, just affecting right leg    Social History Social History   Tobacco Use  . Smoking status: Current Every Day Smoker    Packs/day: 1.00    Years: 25.00    Pack years: 25.00    Types: Cigarettes    Start date: 09/28/1992  . Smokeless tobacco: Never Used  Substance Use Topics  . Alcohol use: Yes    Comment: occasionally on weekends  . Drug use: No     Allergies   Patient has no known allergies.   Review of Systems Review of Systems  All other systems reviewed and are negative.    Physical Exam Updated Vital Signs BP 118/70 (BP Location: Left Arm)   Pulse 79   Temp 98.5 F (36.9 C) (Oral)   Resp 17   LMP 07/09/2018   SpO2 100%   Physical Exam Vitals signs and nursing note reviewed.  Constitutional:      Appearance: She is well-developed.  HENT:     Head: Normocephalic.  Neck:     Musculoskeletal: Normal range of motion.  Pulmonary:     Effort: Pulmonary effort is normal.  Abdominal:     General: There is no distension.  Musculoskeletal: Normal range of motion.  Skin:    Comments: Diffuse generalized macular rash.  There are some areas that are consistent with  ringworm on her back.  Neurological:     Mental Status: She is alert and oriented to person, place, and time.      ED Treatments / Results  Labs (all labs ordered are listed, but only abnormal results are displayed) Labs Reviewed - No data to display  EKG None  Radiology No results found.  Procedures Procedures (including critical care time)  Medications Ordered in ED Medications - No data to display   Initial Impression / Assessment and Plan / ED Course  I have reviewed the triage vital signs and the nursing notes.  Pertinent labs & imaging results that were available during my care of the patient were reviewed by me and considered in my medical decision making (see chart for details).        Recommend primary care and dermatology follow-up.  Will initiate on  clotrimazole cream.  No life-threatening emergency present.  This is not a life-threatening rash.  She is stable for discharge and continued outpatient follow-up.  Final Clinical Impressions(s) / ED Diagnoses   Final diagnoses:  Rash    ED Discharge Orders         Ordered    clotrimazole (LOTRIMIN) 1 % cream     08/02/18 1101           Azalia Bilis, MD 08/08/18 1153

## 2020-04-25 ENCOUNTER — Other Ambulatory Visit: Payer: Self-pay

## 2020-04-25 DIAGNOSIS — Z1231 Encounter for screening mammogram for malignant neoplasm of breast: Secondary | ICD-10-CM

## 2020-05-24 ENCOUNTER — Other Ambulatory Visit: Payer: Self-pay

## 2020-05-24 ENCOUNTER — Ambulatory Visit
Admission: RE | Admit: 2020-05-24 | Discharge: 2020-05-24 | Disposition: A | Discharge status: Home / Self Care | Payer: No Typology Code available for payment source | Source: Ambulatory Visit | Attending: Obstetrics and Gynecology | Admitting: Obstetrics and Gynecology

## 2020-05-24 ENCOUNTER — Ambulatory Visit: Payer: Self-pay | Admitting: *Deleted

## 2020-05-24 VITALS — BP 106/72 | Wt 158.2 lb

## 2020-05-24 DIAGNOSIS — Z01419 Encounter for gynecological examination (general) (routine) without abnormal findings: Secondary | ICD-10-CM

## 2020-05-24 DIAGNOSIS — Z1231 Encounter for screening mammogram for malignant neoplasm of breast: Secondary | ICD-10-CM

## 2020-05-24 DIAGNOSIS — N898 Other specified noninflammatory disorders of vagina: Secondary | ICD-10-CM

## 2020-05-24 NOTE — Patient Instructions (Signed)
Explained breast self awareness with Karina Fritz. Pap smear completed today. Let her know [facility]CP will cover Pap smears and HPV typing every 5 years unless has a history of abnormal Pap smears. Referred patient to the [facility] for a screening mammogram on the mobile unit. Appointment scheduled Thursday, May 24, 2020 at 1400. Patient aware of appointment and will be there. Let patient know will follow up with her within the next week with results of her Pap smear and wet prep by phone. Informed patient that the Breast Center will follow-up with her within the next couple of weeks with results of her mammogram by letter or phone. Discussed smoking cessation with patient. Referred to the [facility] Quitline and gave resources to the free smoking cessation classes at [facility]. Karina Fritz verbalized understanding.  Jarreau Callanan, Kathaleen Maser, RN 1:26 PM

## 2020-05-24 NOTE — Progress Notes (Signed)
Karina Fritz is a 40 y.o. No obstetric history on file. female who presents to [facility] clinic today with complaint of occasionally bilateral breast soreness x 2 years. Patient rates the pain at a 2 out of 10 when it occurs. Patient denies any pain today.    Pap Smear: Pap smear completed today. Last Pap smear was in August 2009 at [facility] [place] clinic and was normal per patient. Per patient has no history of an abnormal Pap smear. Last Pap smear result is not available in Epic.   Physical exam: Breasts Breasts symmetrical. No skin abnormalities bilateral breasts. No nipple retraction bilateral breasts. No nipple discharge bilateral breasts. No lymphadenopathy. No lumps palpated bilateral breasts. Complaints of diffuse bilateral breast tenderness on exam. Patient stated that her menstrual cycle is due soon.      Pelvic/Bimanual Ext Genitalia No lesions, no swelling and no discharge observed on external genitalia.        Vagina Vagina pink and normal texture. No lesions and thick white discharge observed in vagina. Wet prep completed.        Cervix Cervix is present. Cervix pink and of normal texture. Thick white discharge observed on cervix.   Uterus Uterus is present and palpable. Uterus in normal position and normal size.        Adnexae Bilateral ovaries present and palpable. No tenderness on palpation.         Rectovaginal No rectal exam completed today since patient had no rectal complaints. No skin abnormalities observed on exam.     Smoking History: Patient is a current smoker. Discussed smoking cessation with patient. Referred to the [facility] Quitline and gave resources to the free smoking cessation classes at [facility].    Patient Navigation: Patient education provided. Access to services provided for patient through BCCCP program.    Breast and Cervical Cancer Risk Assessment: Patient does not have family history of breast cancer,  known genetic mutations, or radiation treatment to the chest before age 66. Patient does not have history of cervical dysplasia, immunocompromised, or DES exposure in-utero.  Risk Assessment    Risk Scores      05/24/2020   Last edited by: Narda Rutherford, LPN   5-year risk: 0.6 %   Lifetime risk: 9.6 %          A: BCCCP exam with pap smear Complaint of occasional bilateral breast soreness.  P: Referred patient to the [facility] for a screening mammogram on the mobile unit. Appointment scheduled Thursday, May 24, 2020 at 1400.  Priscille Heidelberg, RN 05/24/2020 1:26 PM

## 2020-05-25 ENCOUNTER — Telehealth: Payer: Self-pay

## 2020-05-25 LAB — CERVICOVAGINAL ANCILLARY ONLY
Bacterial Vaginitis (gardnerella): POSITIVE — AB
Candida Glabrata: NEGATIVE
Candida Vaginitis: NEGATIVE
Comment: NEGATIVE
Comment: NEGATIVE
Comment: NEGATIVE
Comment: NEGATIVE
Trichomonas: NEGATIVE

## 2020-05-25 LAB — CYTOLOGY - PAP
Comment: NEGATIVE
Diagnosis: NEGATIVE
High risk HPV: NEGATIVE

## 2020-05-25 NOTE — Progress Notes (Signed)
Patient informed positive BV. Patient stated that this is her 3rd time having BV within a 2 month span. Each time patient was prescribed Metronidazole Gel. Patient completed last prescription 1 week ago, symptoms did not improve, still has odor, white discharge. Patient has difficulty swallowing pills and tolerating oral meds, but will do so if needed. Is oral Metronidazole safe to take after 3 prescriptions of Metrogel? [facility])

## 2020-05-25 NOTE — Telephone Encounter (Signed)
Attempted to contact patients regarding lab results (Wet prep). Left message on voicemail requesting return call.

## 2020-05-28 ENCOUNTER — Telehealth: Payer: Self-pay

## 2020-05-28 ENCOUNTER — Other Ambulatory Visit: Payer: Self-pay | Admitting: Obstetrics and Gynecology

## 2020-05-28 DIAGNOSIS — R928 Other abnormal and inconclusive findings on diagnostic imaging of breast: Secondary | ICD-10-CM

## 2020-05-28 MED ORDER — CLINDAMYCIN HCL 300 MG PO CAPS: 300.0000 mg | ORAL_CAPSULE | Freq: Two times a day (BID) | ORAL | 0 refills | Status: DC

## 2020-05-28 NOTE — Addendum Note (Signed)
Addended by: Catalina Antigua on: 05/28/2020 09:12 AM   Modules accepted: Orders

## 2020-05-28 NOTE — Telephone Encounter (Addendum)
Patient informed recommendations per Dr. Jolayne Panther, and negative Pap/HPV results. Patient also stated she received a message from the Breast Center stating she needed to return to the Breast Center for further imaging. Patient informed importance of follow-up diagnostic mammogram with possible ultrasounds. Patient informed that the physician/radiologists will discuss the results during appointment. Patient informed BCCCP policy and guidelines regarding coverage for diagnostic mammogram and ultrasound. Patient verbalized understanding.    ----- Message from Catalina Antigua, MD sent at 05/28/2020  9:12 AM EDT ----- Rx clindamycin e-prescribed. Clindamycin vaginal gel does not appear to be covered by her insurance, but that would be another option

## 2020-06-14 ENCOUNTER — Other Ambulatory Visit: Payer: Self-pay

## 2020-06-14 ENCOUNTER — Ambulatory Visit
Admission: RE | Admit: 2020-06-14 | Discharge: 2020-06-14 | Disposition: A | Discharge status: Home / Self Care | Payer: BLUE CROSS/BLUE SHIELD | Source: Ambulatory Visit | Attending: Obstetrics and Gynecology | Admitting: Obstetrics and Gynecology

## 2020-06-14 DIAGNOSIS — R928 Other abnormal and inconclusive findings on diagnostic imaging of breast: Secondary | ICD-10-CM

## 2020-07-04 ENCOUNTER — Telehealth: Payer: Self-pay

## 2020-07-04 NOTE — Telephone Encounter (Signed)
Patient called and stated that she had pap smear/wet prep results, diagnosed with bacterial vaginitis. Patient stated the medication (clindamycin) did not relieve symptoms. She continues to have symptoms, seems to be ongoing, requests referral to gynecologist. Patient given contact information for [facility].Patient was also informed gynecology visit is not covered by [facility], can apply for [facility]. Patient verbalized understanding.

## 2020-07-17 ENCOUNTER — Ambulatory Visit (INDEPENDENT_AMBULATORY_CARE_PROVIDER_SITE_OTHER): Payer: Medicaid Other | Admitting: Obstetrics and Gynecology

## 2020-07-17 ENCOUNTER — Other Ambulatory Visit: Payer: Self-pay

## 2020-07-17 ENCOUNTER — Encounter: Payer: Self-pay | Admitting: Obstetrics and Gynecology

## 2020-07-17 VITALS — BP 101/68 | HR 71 | Ht <= 58 in | Wt 157.0 lb

## 2020-07-17 DIAGNOSIS — B9689 Other specified bacterial agents as the cause of diseases classified elsewhere: Secondary | ICD-10-CM

## 2020-07-17 DIAGNOSIS — N760 Acute vaginitis: Secondary | ICD-10-CM

## 2020-07-17 MED ORDER — METRONIDAZOLE 0.75 % VA GEL: 1.0000 | Freq: Every day | VAGINAL | 1 refills | Status: DC

## 2020-07-17 NOTE — Patient Instructions (Signed)
Bacterial Vaginosis  Bacterial vaginosis is an infection of the vagina. It happens when too many normal germs (healthy bacteria) grow in the vagina. This infection can make it easier to get other infections from sex (STIs). It is very important for pregnant women to get treated. This infection can cause babies to be born early or at a low birth weight. What are the causes? This infection is caused by an increase in certain germs that grow in the vagina. You cannot get this infection from toilet seats, bedsheets, swimming pools, or things that touch your vagina. What increases the risk?  Having sex with a new person or more than one person.  Having sex without protection.  Douching.  Having an intrauterine device (IUD).  Smoking.  Using drugs or drinking alcohol. These can lead you to do things that are risky.  Taking certain antibiotic medicines.  Being pregnant. What are the signs or symptoms? Some women have no symptoms. Symptoms may include:  A discharge from your vagina. It may be gray or white. It can be watery or foamy.  A fishy smell. This can happen after sex or during your menstrual period.  Itching in and around your vagina.  A feeling of burning or pain when you pee (urinate). How is this treated? This infection is treated with antibiotic medicines. These may be given to you as:  A pill.  A cream for your vagina.  A medicine that you put into your vagina (suppository). If the infection comes back after treatment, you may need more antibiotics. Follow these instructions at home: Medicines  Take over-the-counter and prescription medicines as told by your doctor.  Take or use your antibiotic medicine as told by your doctor. Do not stop taking or using it, even if you start to feel better. General instructions  If the person you have sex with is a woman, tell her that you have this infection. She will need to follow up with her doctor. If you have a female  partner, he does not need to be treated.  Do not have sex until you finish treatment.  Drink enough fluid to keep your pee pale yellow.  Keep your vagina and butt clean. ? Wash the area with warm water each day. ? Wipe from front to back after you use the toilet.  If you are breastfeeding a baby, ask your doctor if you should keep doing so during treatment.  Keep all follow-up visits. How is this prevented? Self-care  Do not douche.  Use only warm water to wash around your vagina.  Wear underwear that is cotton or lined with cotton.  Do not wear tight pants and pantyhose, especially in the summer. Safe sex  Use protection when you have sex. This includes: ? Use condoms. ? Use dental dams. This is a thin layer that protects the mouth during oral sex.  Limit how many people you have sex with. To prevent this infection, it is best to have sex with just one person.  Get tested for STIs. The person you have sex with should also get tested. Drugs and alcohol  Do not smoke or use any products that contain nicotine or tobacco. If you need help quitting, ask your doctor.  Do not use drugs.  Do not drink alcohol if: ? Your doctor tells you not to drink. ? You are pregnant, may be pregnant, or are planning to become pregnant.  If you drink alcohol: ? Limit how much you have to 0-1 drink   a day. ? Know how much alcohol is in your drink. In the [place] one drink equals one 12 oz bottle of beer (355 mL), one 5 oz glass of wine (148 mL), or one 1 oz glass of hard liquor (44 mL). Where to find more information  Centers for Disease Control and Prevention: www.cdc.gov  American Sexual Health Association: www.ashastd.org  Office on Women's Health: www.womenshealth.gov Contact a doctor if:  Your symptoms do not get better, even after you are treated.  You have more discharge or pain when you pee.  You have a fever or chills.  You have pain in your belly (abdomen) or in the area  between your hips.  You have pain with sex.  You bleed from your vagina between menstrual periods. Summary  This infection can happen when too many germs (bacteria) grow in the vagina.  This infection can make it easier to get infections from sex (STIs). Treating this can lower that chance.  Get treated if you are pregnant. This infection can cause babies to be born early.  Do not stop taking or using your antibiotic medicine, even if you start to feel better. This information is not intended to replace advice given to you by your health care provider. Make sure you discuss any questions you have with your health care provider. Document Revised: 08/25/2019 Document Reviewed: 08/25/2019 Elsevier Patient Education  2021 Elsevier Inc.  

## 2020-07-17 NOTE — Progress Notes (Signed)
   History:  [name] is a 40 y.o. No obstetric history on file. 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  Review of Systems:  Review of Systems  Constitutional: Negative for chills and fever.  Gastrointestinal: Negative for abdominal pain, constipation, diarrhea, nausea and vomiting.  Genitourinary: Negative for dysuria, flank pain, frequency and urgency.  Neurological: Negative for dizziness.      Objective:  Physical Exam BP 101/68   Pulse 71   Ht 4\' 10"  (1.473 m)   Wt 157 lb (71.2 kg)   LMP 07/16/2020   BMI 32.81 kg/m  Physical Exam Vitals and nursing note reviewed.  Constitutional:      Appearance: Normal appearance.  HENT:     Head: Normocephalic.  Cardiovascular:     Rate and Rhythm: Normal rate and regular rhythm.  Abdominal:     Palpations: Abdomen is soft.  Neurological:     Mental Status: She is alert.  Psychiatric:        Mood and Affect: Mood normal.        Behavior: Behavior normal.       Labs and Imaging No results found for this or any previous visit (from the past 24 hour(s)).  No results found.   Assessment & Plan:  1. Bacterial vaginosis -will treat with metrogel. Discussed preventative measures - avoiding douching, wearing loose clothing, probiotics, etc.  -going forward would recommend trial of longer term metrogel for recurrent BV. (ten day course followed by intermittent dosing).    09/15/2020, MD 07/17/2020 3:47 PM    09/16/2020, MD [facility] Fellow, [facility] for [facility], [facility]

## 2020-11-14 ENCOUNTER — Telehealth: Payer: Self-pay

## 2020-11-14 DIAGNOSIS — B9689 Other specified bacterial agents as the cause of diseases classified elsewhere: Secondary | ICD-10-CM

## 2020-11-14 DIAGNOSIS — N760 Acute vaginitis: Secondary | ICD-10-CM

## 2020-11-14 MED ORDER — METRONIDAZOLE 0.75 % VA GEL: 1.0000 | Freq: Two times a day (BID) | VAGINAL | 0 refills | Status: DC

## 2020-11-14 NOTE — Telephone Encounter (Signed)
Patient called and left voicemail on nurse line reporting symptoms of BV and requesting refill. Called pt. Pt reports white discharge with foul odor, states this feels like prior BV infections. Requests Metrogel; sent to patient's pharmacy. Pt states she gets BV often, starting to seem like it is after each period. Recommended pt try baking soda baths or tea trea oil tampons following menstrual period. Encouraged pt to call us as needed and to schedule annual visit March 2023.

## 2021-01-15 ENCOUNTER — Other Ambulatory Visit: Payer: Self-pay

## 2021-01-15 DIAGNOSIS — B9689 Other specified bacterial agents as the cause of diseases classified elsewhere: Secondary | ICD-10-CM

## 2021-01-17 ENCOUNTER — Ambulatory Visit (INDEPENDENT_AMBULATORY_CARE_PROVIDER_SITE_OTHER): Payer: Medicaid Other

## 2021-01-17 ENCOUNTER — Other Ambulatory Visit: Payer: Self-pay

## 2021-01-17 ENCOUNTER — Other Ambulatory Visit (HOSPITAL_COMMUNITY)
Admission: RE | Admit: 2021-01-17 | Discharge: 2021-01-17 | Disposition: A | Discharge status: Home / Self Care | Payer: Medicaid Other | Source: Ambulatory Visit | Attending: Family Medicine | Admitting: Family Medicine

## 2021-01-17 VITALS — BP 110/63 | HR 67 | Wt 148.9 lb

## 2021-01-17 DIAGNOSIS — N898 Other specified noninflammatory disorders of vagina: Secondary | ICD-10-CM | POA: Diagnosis present

## 2021-01-17 MED ORDER — METRONIDAZOLE 0.75 % VA GEL: 1.0000 | Freq: Two times a day (BID) | VAGINAL | 0 refills | Status: DC

## 2021-01-17 NOTE — Progress Notes (Signed)
Patient was assessed and managed by nursing staff during this encounter. I have reviewed the chart and agree with the documentation and plan.   Jaynie Collins, MD 01/17/2021 12:28 PM

## 2021-01-17 NOTE — Progress Notes (Signed)
Here today with complaint of white vaginal discharge with foul odor. Reports this is the same odor she has noticed with BV in the past. Reports history of recurrent BV. Last seen by provider 07/17/20. Self swab instructions given and specimen obtained. Explained I will send treatment for BV today and we will follow up with any abnormal results. Pt states she is unable to take Flagyl tablets, requests vaginal gel. I explained there is significant cost difference, pt would still like Metrogel sent to pharmacy.   Recommended pt schedule provider appt to discuss treatment for recurrent BV if patient does not feel current regimen is working.   Fleet Contras RN 01/17/21

## 2021-01-18 ENCOUNTER — Other Ambulatory Visit: Payer: Self-pay | Admitting: Obstetrics & Gynecology

## 2021-01-18 DIAGNOSIS — N760 Acute vaginitis: Secondary | ICD-10-CM

## 2021-01-18 LAB — CERVICOVAGINAL ANCILLARY ONLY
Bacterial Vaginitis (gardnerella): POSITIVE — AB
Candida Glabrata: NEGATIVE
Candida Vaginitis: NEGATIVE
Chlamydia: NEGATIVE
Comment: NEGATIVE
Comment: NEGATIVE
Comment: NEGATIVE
Comment: NEGATIVE
Comment: NEGATIVE
Comment: NORMAL
Neisseria Gonorrhea: NEGATIVE
Trichomonas: NEGATIVE

## 2021-01-18 MED ORDER — METRONIDAZOLE 0.75 % VA GEL: 1.0000 | Freq: Every day | VAGINAL | 5 refills | Status: DC

## 2021-02-04 ENCOUNTER — Other Ambulatory Visit: Payer: Self-pay | Admitting: Obstetrics & Gynecology

## 2021-02-04 DIAGNOSIS — N760 Acute vaginitis: Secondary | ICD-10-CM

## 2021-02-20 ENCOUNTER — Other Ambulatory Visit (HOSPITAL_COMMUNITY)
Admission: RE | Admit: 2021-02-20 | Discharge: 2021-02-20 | Disposition: A | Discharge status: Home / Self Care | Payer: Medicaid Other | Source: Ambulatory Visit | Attending: Certified Nurse Midwife | Admitting: Certified Nurse Midwife

## 2021-02-20 ENCOUNTER — Ambulatory Visit: Payer: Medicaid Other | Admitting: Certified Nurse Midwife

## 2021-02-20 ENCOUNTER — Other Ambulatory Visit: Payer: Self-pay

## 2021-02-20 VITALS — BP 120/74 | HR 92 | Wt 140.2 lb

## 2021-02-20 DIAGNOSIS — Z8742 Personal history of other diseases of the female genital tract: Secondary | ICD-10-CM | POA: Diagnosis not present

## 2021-02-20 DIAGNOSIS — Z202 Contact with and (suspected) exposure to infections with a predominantly sexual mode of transmission: Secondary | ICD-10-CM | POA: Diagnosis present

## 2021-02-20 NOTE — Patient Instructions (Signed)
At Home Vaginal BV Prevention: If engaging in intercourse without a barrier method, consider using semen sponges afterward. You can buy them at [employer] Begin taking a daily probiotic  Use a gentle antibacterial soap on your vulva (never in the vagina), such as Alaffia's Liquid Black Soap with Tea Tree. Avoid scented or harsh soaps. Use a "tea tree tampon" once a month: Melt 1/4c virgin coconut oil, add 1-2 drops of quality tea tree oil.  Soak a tampon in the mixture  Insert overnight the day after your period has ended. 5.   Place a 30mg boric acid tablet vaginally every night for 21 nights, then weekly for        9 weeks.  

## 2021-02-20 NOTE — Progress Notes (Signed)
History:  [name] is a 40 y.o. nulliparous patient who presents to clinic today for possible STI exposure and recurrent BV for the past two months. A former sexual partner divulged that they have herpes, the last time she had IC with this person was in September and she has been asymptomatic for herpes since then. Strongly desires STI testing via swab and blood today, including HSV blood testing.  Had a BV infection years ago and has started to have recurrent BV after her periods. Is on the long-term metrogel twice weekly regimen but wants to know why this has started happening. No other physical complaints. Up to date on pap/mammogram.  The following portions of the patient's history were reviewed and updated as appropriate: allergies, current medications, family history, past medical history, social history, past surgical history and problem list.  Review of Systems:  Pertinent items noted in HPI and remainder of comprehensive ROS otherwise negative.   Objective:  Physical Exam BP 120/74    Pulse 92    Wt 140 lb 3.2 oz (63.6 kg)    LMP 02/04/2021 (Approximate)    BMI 29.30 kg/m  Physical Exam Vitals and nursing note reviewed.  Constitutional:      Appearance: Normal appearance.  HENT:     Head: Normocephalic.  Eyes:     Pupils: Pupils are equal, round, and reactive to light.  Cardiovascular:     Rate and Rhythm: Normal rate and regular rhythm.  Pulmonary:     Effort: Pulmonary effort is normal.  Musculoskeletal:        General: Normal range of motion.  Skin:    General: Skin is warm.     Capillary Refill: Capillary refill takes less than 2 seconds.  Neurological:     Mental Status: She is alert and oriented to person, place, and time.  Psychiatric:        Mood and Affect: Mood normal.        Behavior: Behavior normal.        Thought Content: Thought content normal.   Labs and Imaging Labs collected.   Assessment & Plan:  1. Exposure to sexually transmitted  disease (STD) - Advised that antibody testing for HSV without presence of current infection is not considered a reliable predictor of whether she will get an outbreak, pt still desires testing. - Hepatitis C Antibody - Hepatitis B surface antigen - RPR - HSV 2 antibody, IgG - Cervicovaginal ancillary only( ) - HIV antibody (with reflex)  2. History of recurrent vaginal discharge - Advised to stay on track with current regimen, offered suggestions for future treatment/prevention. Included in AVS.  Will follow testing and manage as indicated. Follow up PRN or for annual testing.   Bernerd Limbo, CNM 02/20/2021 11:50 AM

## 2021-02-21 ENCOUNTER — Encounter: Payer: Self-pay | Admitting: Certified Nurse Midwife

## 2021-02-21 LAB — CERVICOVAGINAL ANCILLARY ONLY
Chlamydia: NEGATIVE
Comment: NEGATIVE
Comment: NEGATIVE
Comment: NORMAL
Neisseria Gonorrhea: NEGATIVE
Trichomonas: NEGATIVE

## 2021-02-21 LAB — RPR: RPR Ser Ql: NONREACTIVE

## 2021-02-21 LAB — HIV ANTIBODY (ROUTINE TESTING W REFLEX): HIV Screen 4th Generation wRfx: NONREACTIVE

## 2021-02-21 LAB — HEPATITIS C ANTIBODY: Hep C Virus Ab: <0.1 s/co ratio (ref 0.0–0.9)

## 2021-02-21 LAB — HEPATITIS B SURFACE ANTIGEN: Hepatitis B Surface Ag: NEGATIVE

## 2021-02-21 LAB — HSV 2 ANTIBODY, IGG: HSV 2 IgG, Type Spec: 7.05 index — ABNORMAL HIGH (ref 0.00–0.90)

## 2021-03-08 ENCOUNTER — Other Ambulatory Visit: Payer: Self-pay

## 2021-03-08 ENCOUNTER — Emergency Department (HOSPITAL_BASED_OUTPATIENT_CLINIC_OR_DEPARTMENT_OTHER)
Admission: EM | Admit: 2021-03-08 | Discharge: 2021-03-08 | Disposition: A | Discharge status: Home / Self Care | Payer: Medicaid Other | Attending: Emergency Medicine | Admitting: Emergency Medicine

## 2021-03-08 ENCOUNTER — Emergency Department (HOSPITAL_BASED_OUTPATIENT_CLINIC_OR_DEPARTMENT_OTHER): Payer: Medicaid Other | Admitting: Radiology

## 2021-03-08 ENCOUNTER — Encounter (HOSPITAL_BASED_OUTPATIENT_CLINIC_OR_DEPARTMENT_OTHER): Payer: Self-pay | Admitting: Emergency Medicine

## 2021-03-08 DIAGNOSIS — F17210 Nicotine dependence, cigarettes, uncomplicated: Secondary | ICD-10-CM | POA: Diagnosis not present

## 2021-03-08 DIAGNOSIS — R1013 Epigastric pain: Secondary | ICD-10-CM | POA: Diagnosis present

## 2021-03-08 DIAGNOSIS — K2970 Gastritis, unspecified, without bleeding: Secondary | ICD-10-CM

## 2021-03-08 DIAGNOSIS — K2971 Gastritis, unspecified, with bleeding: Secondary | ICD-10-CM | POA: Insufficient documentation

## 2021-03-08 DIAGNOSIS — I10 Essential (primary) hypertension: Secondary | ICD-10-CM | POA: Insufficient documentation

## 2021-03-08 DIAGNOSIS — R519 Headache, unspecified: Secondary | ICD-10-CM | POA: Diagnosis not present

## 2021-03-08 LAB — BASIC METABOLIC PANEL
Anion gap: 7 (ref 5–15)
BUN: 7 mg/dL (ref 6–20)
CO2: 27 mmol/L (ref 22–32)
Calcium: 9.1 mg/dL (ref 8.9–10.3)
Chloride: 105 mmol/L (ref 98–111)
Creatinine, Ser: 0.73 mg/dL (ref 0.44–1.00)
GFR, Estimated: >60 mL/min (ref >60)
Glucose, Bld: 93 mg/dL (ref 70–99)
Potassium: 4.1 mmol/L (ref 3.5–5.1)
Sodium: 139 mmol/L (ref 135–145)

## 2021-03-08 LAB — CBC
HCT: 38.7 % (ref 36.0–46.0)
Hemoglobin: 12.5 g/dL (ref 12.0–15.0)
MCH: 29.1 pg (ref 26.0–34.0)
MCHC: 32.3 g/dL (ref 30.0–36.0)
MCV: 90.2 fL (ref 80.0–100.0)
Platelets: 277 10*3/uL (ref 150–400)
RBC: 4.29 MIL/uL (ref 3.87–5.11)
RDW: 13.5 % (ref 11.5–15.5)
WBC: 7.2 10*3/uL (ref 4.0–10.5)
nRBC: 0 % (ref 0.0–0.2)

## 2021-03-08 LAB — TROPONIN I (HIGH SENSITIVITY): Troponin I (High Sensitivity): <2 ng/L (ref <18)

## 2021-03-08 LAB — PREGNANCY, URINE: Preg Test, Ur: NEGATIVE

## 2021-03-08 MED ORDER — LIDOCAINE VISCOUS HCL 2 % MT SOLN
15.0000 mL | Freq: Once | OROMUCOSAL | Status: AC
  Administered 2021-03-08: 12:00:00 15 mL via ORAL
  Filled 2021-03-08: qty 15

## 2021-03-08 MED ORDER — FAMOTIDINE 20 MG PO TABS
20.0000 mg | ORAL_TABLET | Freq: Once | ORAL | Status: AC
  Administered 2021-03-08: 12:00:00 20 mg via ORAL
  Filled 2021-03-08: qty 1

## 2021-03-08 MED ORDER — ALUM & MAG HYDROXIDE-SIMETH 200-200-20 MG/5ML PO SUSP
30.0000 mL | Freq: Once | ORAL | Status: AC
  Administered 2021-03-08: 12:00:00 30 mL via ORAL
  Filled 2021-03-08: qty 30

## 2021-03-08 MED ORDER — FAMOTIDINE 20 MG PO TABS: 20.0000 mg | ORAL_TABLET | Freq: Two times a day (BID) | ORAL | 2 refills | Status: DC

## 2021-03-08 NOTE — ED Notes (Signed)
Pt requesting pain medication, denies otc meds pta

## 2021-03-08 NOTE — ED Provider Notes (Signed)
[facility] DEPT Provider Note   CSN: [id number] Arrival date & time: 03/08/21  [id number]     History Chief Complaint  Patient presents with   Chest Pain    Karina Fritz is a 40 y.o. female with a history of acid reflux presenting to ED with epigastric discomfort.  Patient reports that for the past 3 days she has gradual onset of epigastric discomfort, radiating up and towards her mid sternum.  It hurts more when she swallows.  She says she had a similar episode about a month ago, which gradually resolved over a few days.  Her symptoms appear to be worse at night when laying in bed.  They are not associated with movement, breathing.  She did try some over-the-counter Prilosec with little relief.  She currently has moderate discomfort  She denies any history of MI, hypertension, hyperlipidemia, significant family history of cardiac disease, or herself history of diabetes.  She reports she is otherwise healthy.  She denies any cough, congestion, shortness of breath.  HPI     History reviewed. No pertinent past medical history.  There are no problems to display for this patient.   Past Surgical History:  Procedure Laterality Date   TUBAL LIGATION  2009     OB History   No obstetric history on file.     Family History  Problem Relation Age of Onset   Hyperlipidemia Mother    Alcohol abuse Father        Alcoholic cirrhosis was cause of death   Cerebral palsy Son        spastic hemiplegia, just affecting right leg    Social History   Tobacco Use   Smoking status: Every Day    Packs/day: 1.00    Years: 25.00    Pack years: 25.00    Types: Cigarettes    Start date: 09/28/1992   Smokeless tobacco: Never  Vaping Use   Vaping Use: Never used  Substance Use Topics   Alcohol use: Yes    Comment: occasionally on weekends   Drug use: No    Home Medications Prior to Admission medications   Medication Sig Start Date End Date Taking?  Authorizing Provider  famotidine (PEPCID) 20 MG tablet Take 1 tablet (20 mg total) by mouth 2 (two) times daily. 03/08/21 04/07/21 Yes Daphyne Miguez, Carola Rhine, MD  metroNIDAZOLE (METROGEL) 0.75 % vaginal gel INSERT 1 APPLICATORFUL VAGINALLY TWICE DAILY Patient not taking: Reported on 02/20/2021 02/05/21   Osborne Oman, MD    Allergies    Patient has no known allergies.  Review of Systems   Review of Systems  Constitutional:  Negative for chills and fever.  HENT:  Positive for trouble swallowing. Negative for voice change.   Eyes:  Negative for pain and visual disturbance.  Respiratory:  Negative for cough and shortness of breath.   Cardiovascular:  Negative for chest pain and palpitations.  Gastrointestinal:  Positive for abdominal pain and nausea. Negative for vomiting.  Musculoskeletal:  Negative for arthralgias and back pain.  Skin:  Negative for color change and rash.  Neurological:  Negative for syncope and numbness.  All other systems reviewed and are negative.  Physical Exam Updated Vital Signs BP 104/76    Pulse (!) 58    Temp 97.8 F (36.6 C)    Resp (!) 21    Ht 4\' 10"  (1.473 m)    Wt 63.5 kg    LMP 02/20/2021    SpO2 99%  BMI 29.26 kg/m   Physical Exam Constitutional:      General: She is not in acute distress. HENT:     Head: Normocephalic and atraumatic.  Eyes:     Conjunctiva/sclera: Conjunctivae normal.     Pupils: Pupils are equal, round, and reactive to light.  Cardiovascular:     Rate and Rhythm: Normal rate and regular rhythm.  Pulmonary:     Effort: Pulmonary effort is normal. No respiratory distress.  Abdominal:     General: There is no distension.     Tenderness: There is no abdominal tenderness.  Skin:    General: Skin is warm and dry.  Neurological:     General: No focal deficit present.     Mental Status: She is alert. Mental status is at baseline.  Psychiatric:        Mood and Affect: Mood normal.        Behavior: Behavior normal.    ED  Results / Procedures / Treatments   Labs (all labs ordered are listed, but only abnormal results are displayed) Labs Reviewed  BASIC METABOLIC PANEL  CBC  PREGNANCY, URINE  TROPONIN I (HIGH SENSITIVITY)    EKG EKG Interpretation  Date/Time:  Friday March 08 2021 07:59:24 EST Ventricular Rate:  62 PR Interval:  162 QRS Duration: 76 QT Interval:  408 QTC Calculation: 414 R Axis:   96 Text Interpretation: Normal sinus rhythm Rightward axis Possible Anterior infarct , age undetermined Abnormal ECG No previous ECGs available Confirmed by Octaviano Glow [phone]) on 03/08/2021 9:29:23 AM  Radiology DG Chest 2 View  Result Date: 03/08/2021 CLINICAL DATA:  Chest pain EXAM: CHEST - 2 VIEW COMPARISON:  None. FINDINGS: Heart size and mediastinal contours are within normal limits. No suspicious pulmonary opacities identified. No pleural effusion or pneumothorax visualized. No acute osseous abnormality appreciated. IMPRESSION: No acute intrathoracic process identified. Electronically Signed   By: Ofilia Neas M.D.   On: 03/08/2021 08:29    Procedures Procedures   Medications Ordered in ED Medications  alum & mag hydroxide-simeth (MAALOX/MYLANTA) 200-200-20 MG/5ML suspension 30 mL (30 mLs Oral Given 03/08/21 1153)    And  lidocaine (XYLOCAINE) 2 % viscous mouth solution 15 mL (15 mLs Oral Given 03/08/21 1153)  famotidine (PEPCID) tablet 20 mg (20 mg Oral Given 03/08/21 1152)    ED Course  I have reviewed the triage vital signs and the nursing notes.  Pertinent labs & imaging results that were available during my care of the patient were reviewed by me and considered in my medical decision making (see chart for details).  This patient presents to the Emergency Department with complaint of chest pain. This involves an extensive number of treatment options, and is a complaint that carries with it a high risk of complications and morbidity.  The differential diagnosis includes ACS vs  Pneumothorax vs Reflux/Gastritis vs MSK pain vs Pneumonia vs other.  I do suspect this to be consistent with gastritis or possible esophagitis given that her symptoms are worse with swallowing, and given that they are worse with lying down at night.  I felt PE was less likely given that she is PERC negative  I ordered, reviewed, and interpreted labs, including BMP and CBC.  There were no immediate, life-threatening emergencies found in this labwork.  The patient's troponin level was <2 with 48 hours of symptoms.  This would be highly consistent with ACS, and she overall has a low heart score from a coronary perspective I ordered medication GI  medications for suspected gastritis. I ordered imaging studies which included x-ray of the chest I independently visualized and interpreted imaging which showed no focal infiltrate or evidence of pneumonia or pneumothorax and the monitor tracing which showed normal sinus I personally reviewed the patients ECG which showed sinus rhythm with no acute ischemic findings  After the interventions stated above, I reevaluated the patient and found that they remained clinically stable.  Based on the patient's clinical exam, vital signs, risk factors, and ED testing, I felt that the patient's overall risk of life-threatening emergency such as ACS, PE, sepsis, or infection was low.  At this time, I felt the patient's presentation was most clinically consistent with reflux gastritis and esophagitis, but explained to the patient that this evaluation was not a definitive diagnostic workup.  I discussed outpatient follow up with primary care provider, and provided specialist office number on the patient's discharge paper if a referral was deemed necessary.  Return precautions were discussed with the patient.  I felt the patient was clinically stable for discharge.   Clinical Course as of 03/08/21 1513  Fri Mar 08, 2021  1240 Patient feels significantly better after GI  cocktail.  I suspect this may be gastritis.  Reflux medications prescribed and precautions given.  Okay for discharge [MT]    Clinical Course User Index [MT] Nilo Fallin, Kermit Balo, MD   Final Clinical Impression(s) / ED Diagnoses Final diagnoses:  Gastritis, presence of bleeding unspecified, unspecified chronicity, unspecified gastritis type    Rx / DC Orders ED Discharge Orders          Ordered    Ambulatory referral to Gastroenterology       Comments: Reflux/esophagitis   03/08/21 1240    famotidine (PEPCID) 20 MG tablet  2 times daily        03/08/21 1241             Terald Sleeper, MD 03/08/21 1513

## 2021-03-08 NOTE — Discharge Instructions (Addendum)
Try to avoid spicy food which can be a trigger for acid reflux.  Try not to eat anything at least 3 to 4 hours before bedtime.  For the next few nights he can sleep propped up on 2 pillows to prevent acid from washing up your esophagus.  You can take the Pepcid as prescribed, 30 minutes before breakfast and dinner.

## 2021-03-08 NOTE — ED Notes (Signed)
First contact with patient. Pt arrived via triage from home with c/o CP pain. Pt. denies shob, is A&OX 4, resp. even/unlabored. Pt placed into gown and on cardiac monitor, call light within reach. Patient updated on plan of care. Will continue to monitor patient.

## 2021-03-08 NOTE — ED Triage Notes (Signed)
Central chest pain x 2 days. Denies SOB

## 2021-03-18 ENCOUNTER — Other Ambulatory Visit: Payer: Self-pay

## 2021-03-18 ENCOUNTER — Encounter: Payer: Self-pay | Admitting: Family Medicine

## 2021-03-18 ENCOUNTER — Ambulatory Visit (INDEPENDENT_AMBULATORY_CARE_PROVIDER_SITE_OTHER): Payer: Medicaid Other | Admitting: Family Medicine

## 2021-03-18 VITALS — BP 111/78 | HR 78 | Temp 97.9°F | Resp 16 | Ht <= 58 in | Wt 137.4 lb

## 2021-03-18 DIAGNOSIS — F17210 Nicotine dependence, cigarettes, uncomplicated: Secondary | ICD-10-CM | POA: Diagnosis not present

## 2021-03-18 DIAGNOSIS — Z7689 Persons encountering health services in other specified circumstances: Secondary | ICD-10-CM | POA: Diagnosis not present

## 2021-03-18 DIAGNOSIS — K219 Gastro-esophageal reflux disease without esophagitis: Secondary | ICD-10-CM

## 2021-03-18 DIAGNOSIS — Z720 Tobacco use: Secondary | ICD-10-CM

## 2021-03-18 NOTE — Progress Notes (Signed)
Patient is her to establish care with provider. Patient is concern about her weight and drinking ensure drinks.   Patient has no other concerns today

## 2021-03-18 NOTE — Progress Notes (Signed)
New Patient Office Visit  Subjective:  Patient ID: Karina Fritz, female    DOB: [birth year]  Age: 41 y.o. MRN: [id number]  CC:  Chief Complaint  Patient presents with   Establish Care    HPI Karina Fritz presents for to establish care. Patient also would like to quit smoking and has  been recently diagnosed with GERD. Patient denies acute complaints.   History reviewed. No pertinent past medical history.  Past Surgical History:  Procedure Laterality Date   TUBAL LIGATION  2009    Family History  Problem Relation Age of Onset   Hyperlipidemia Mother    Alcohol abuse Father        Alcoholic cirrhosis was cause of death   Cerebral palsy Son        spastic hemiplegia, just affecting right leg    Social History   Socioeconomic History   Marital status: Single    Spouse name: Not on file   Number of children: 3   Years of education: Not on file   Highest education level: High school graduate  Occupational History   Not on file  Tobacco Use   Smoking status: Every Day    Packs/day: 1.00    Years: 25.00    Pack years: 25.00    Types: Cigarettes    Start date: 09/28/1992   Smokeless tobacco: Never  Vaping Use   Vaping Use: Never used  Substance and Sexual Activity   Alcohol use: Yes    Comment: occasionally on weekends   Drug use: No   Sexual activity: Yes    Birth control/protection: Surgical  Other Topics Concern   Not on file  Social History Narrative   Not on file   Social Determinants of Health   Financial Resource Strain: Not on file  Food Insecurity: No Food Insecurity   Worried About Running Out of Food in the Last Year: Never true   Ran Out of Food in the Last Year: Never true  Transportation Needs: No Transportation Needs   Lack of Transportation (Medical): No   Lack of Transportation (Non-Medical): No  Physical Activity: Not on file  Stress: Not on file  Social Connections: Not on file  Intimate Partner Violence: Not on file     ROS Review of Systems  All other systems reviewed and are negative.  Objective:   Today's Vitals: BP 111/78    Pulse 78    Temp 97.9 F (36.6 C) (Oral)    Resp 16    Ht 4\' 10"  (1.473 m)    Wt 137 lb 6.4 oz (62.3 kg)    LMP 02/20/2021    SpO2 97%    BMI 28.72 kg/m   Physical Exam Vitals and nursing note reviewed.  Constitutional:      General: She is not in acute distress. Cardiovascular:     Rate and Rhythm: Normal rate and regular rhythm.  Pulmonary:     Effort: Pulmonary effort is normal.     Breath sounds: Normal breath sounds.  Abdominal:     Palpations: Abdomen is soft.     Tenderness: There is no abdominal tenderness.  Neurological:     General: No focal deficit present.     Mental Status: She is alert and oriented to person, place, and time.    Assessment & Plan:   1. Gastroesophageal reflux disease without esophagitis Discussed dietary and activity options in detail. To continue pepcid prn.   2. Smoking trying to quit Discussed smoking cessation  and options in detail. Will monitor  3. Encounter to establish care     Outpatient Encounter Medications as of 03/18/2021  Medication Sig   cyanocobalamin 100 MCG tablet Take 100 mcg by mouth daily.   famotidine (PEPCID) 20 MG tablet Take 1 tablet (20 mg total) by mouth 2 (two) times daily.   metroNIDAZOLE (METROGEL) 0.75 % vaginal gel INSERT 1 APPLICATORFUL VAGINALLY TWICE DAILY   vitamin E 1000 UNIT capsule Take 1,000 Units by mouth daily.   No facility-administered encounter medications on file as of 03/18/2021.    Follow-up: Return in about 6 months (around 09/15/2021) for physical.   Tommie Raymond, MD

## 2021-05-01 ENCOUNTER — Other Ambulatory Visit: Payer: Self-pay | Admitting: Obstetrics and Gynecology

## 2021-05-01 DIAGNOSIS — Z1231 Encounter for screening mammogram for malignant neoplasm of breast: Secondary | ICD-10-CM

## 2021-05-17 ENCOUNTER — Ambulatory Visit (HOSPITAL_COMMUNITY)
Admission: EM | Admit: 2021-05-17 | Discharge: 2021-05-17 | Disposition: A | Discharge status: Home / Self Care | Payer: Medicaid Other | Attending: Family Medicine | Admitting: Family Medicine

## 2021-05-17 ENCOUNTER — Encounter (HOSPITAL_COMMUNITY): Payer: Self-pay

## 2021-05-17 ENCOUNTER — Other Ambulatory Visit: Payer: Self-pay

## 2021-05-17 DIAGNOSIS — J029 Acute pharyngitis, unspecified: Secondary | ICD-10-CM | POA: Insufficient documentation

## 2021-05-17 LAB — POCT RAPID STREP A, ED / UC: Streptococcus, Group A Screen (Direct): NEGATIVE

## 2021-05-17 NOTE — Discharge Instructions (Addendum)
You were seen today for sore throat.  Your rapid strep was negative today.  ?We will send this for culture and will notify you if positive and treatment is needed.  ?You should take tylenol or motrin to help with the severe pain you are having.  You should also use salt water gargles to help with pain.  ?If you develop other symptoms such has persistent cough, fever, chills then please return for re-evaluation.  ?

## 2021-05-17 NOTE — ED Triage Notes (Addendum)
Pt c/o sore throat since yesterday. Took Advil with no relief. Pt c/o lt ear itching as well. ?

## 2021-05-17 NOTE — ED Provider Notes (Signed)
?[facility] ? ? ? ?CSN: [id number] ?Arrival date & time: 05/17/21  0802 ? ? ?  ? ?History   ?Chief Complaint ?Chief Complaint  ?Patient presents with  ? Sore Throat  ? ? ?HPI ?Karina Fritz is a 41 y.o. female.  ? ?Patient is here for a sore throat.  Started yesterday, worse over night.  No runny nose, + sneezing.  Left ear itching.  ?No fevers/chills.  She did have a headache yesterday.  ?No n/v.  No strep exposures.  ? ?History reviewed. No pertinent past medical history. ? ?There are no problems to display for this patient. ? ? ?Past Surgical History:  ?Procedure Laterality Date  ? TUBAL LIGATION  2009  ? ? ?OB History   ?No obstetric history on file. ?  ? ? ? ?Home Medications   ? ?Prior to Admission medications   ?Medication Sig Start Date End Date Taking? Authorizing Provider  ?cyanocobalamin 100 MCG tablet Take 100 mcg by mouth daily.    [provider]  ?famotidine (PEPCID) 20 MG tablet Take 1 tablet (20 mg total) by mouth 2 (two) times daily. 03/08/21 04/07/21  Terald Sleeper, MD  ?metroNIDAZOLE (METROGEL) 0.75 % vaginal gel INSERT 1 APPLICATORFUL VAGINALLY TWICE DAILY 02/05/21   Anyanwu, Jethro Bastos, MD  ?vitamin E 1000 UNIT capsule Take 1,000 Units by mouth daily.    [provider]  ? ? ?Family History ?Family History  ?Problem Relation Age of Onset  ? Hyperlipidemia Mother   ? Alcohol abuse Father   ?     Alcoholic cirrhosis was cause of death  ? Cerebral palsy Son   ?     spastic hemiplegia, just affecting right leg  ? ? ?Social History ?Social History  ? ?Tobacco Use  ? Smoking status: Every Day  ?  Packs/day: 1.00  ?  Years: 25.00  ?  Pack years: 25.00  ?  Types: Cigarettes  ?  Start date: 09/28/1992  ? Smokeless tobacco: Never  ?Vaping Use  ? Vaping Use: Never used  ?Substance Use Topics  ? Alcohol use: Yes  ?  Comment: occasionally on weekends  ? Drug use: No  ? ? ? ?Allergies   ?Patient has no known allergies. ? ? ?Review of Systems ?Review of Systems  ?Constitutional:  Negative.   ?HENT:  Positive for sore throat. Negative for congestion and rhinorrhea.   ? ? ?Physical Exam ?Triage Vital Signs ?ED Triage Vitals [05/17/21 0829]  ?Enc Vitals Group  ?   BP 138/63  ?   Pulse Rate 75  ?   Resp 18  ?   Temp 98.4 ?F (36.9 ?C)  ?   Temp Source Oral  ?   SpO2 98 %  ?   Weight   ?   Height   ?   Head Circumference   ?   Peak Flow   ?   Pain Score 10  ?   Pain Loc   ?   Pain Edu?   ?   Excl. in GC?   ? ?No data found. ? ?Updated Vital Signs ?BP 138/63 (BP Location: Left Arm)   Pulse 75   Temp 98.4 ?F (36.9 ?C) (Oral)   Resp 18   LMP 05/17/2021   SpO2 98%  ? ?Visual Acuity ?Right Eye Distance:   ?Left Eye Distance:   ?Bilateral Distance:   ? ?Right Eye Near:   ?Left Eye Near:    ?Bilateral Near:    ? ?Physical  Exam ?Constitutional:   ?   Appearance: She is well-developed.  ?HENT:  ?   Head: Normocephalic and atraumatic.  ?   Mouth/Throat:  ?   Mouth: Mucous membranes are moist.  ?   Pharynx: Posterior oropharyngeal erythema present. No pharyngeal swelling or oropharyngeal exudate.  ?   Tonsils: No tonsillar exudate.  ?Cardiovascular:  ?   Rate and Rhythm: Normal rate and regular rhythm.  ?Pulmonary:  ?   Effort: Pulmonary effort is normal.  ?   Breath sounds: Normal breath sounds.  ?Musculoskeletal:  ?   Cervical back: Normal range of motion and neck supple.  ?Lymphadenopathy:  ?   Cervical: No cervical adenopathy.  ?Neurological:  ?   Mental Status: She is alert.  ? ? ? ?UC Treatments / Results  ?Labs ?(all labs ordered are listed, but only abnormal results are displayed) ?Labs Reviewed  ?POCT RAPID STREP A, ED / UC  ? ? ?EKG ? ? ?Radiology ?No results found. ? ?Procedures ?Procedures (including critical care time) ? ?Medications Ordered in UC ?Medications - No data to display ? ?Initial Impression / Assessment and Plan / UC Course  ?I have reviewed the triage vital signs and the nursing notes. ? ?Pertinent labs & imaging results that were available during my care of the patient were  reviewed by me and considered in my medical decision making (see chart for details). ? ?  ?Final Clinical Impressions(s) / UC Diagnoses  ? ?Final diagnoses:  ?Viral pharyngitis  ? ? ? ?Discharge Instructions   ? ?  ?You were seen today for sore throat.  Your rapid strep was negative today.  ?We will send this for culture and will notify you if positive and treatment is needed.  ?You should take tylenol or motrin to help with the severe pain you are having.  You should also use salt water gargles to help with pain.  ?If you develop other symptoms such has persistent cough, fever, chills then please return for re-evaluation.  ? ? ? ?ED Prescriptions   ?None ?  ? ?PDMP not reviewed this encounter. ?  Jannifer Franklin, MD ?05/17/21 [phone] ? ?

## 2021-05-19 LAB — CULTURE, GROUP A STREP (THRC)

## 2021-06-17 ENCOUNTER — Ambulatory Visit
Admission: RE | Admit: 2021-06-17 | Discharge: 2021-06-17 | Disposition: A | Discharge status: Home / Self Care | Payer: Medicaid Other | Source: Ambulatory Visit | Attending: Obstetrics and Gynecology | Admitting: Obstetrics and Gynecology

## 2021-06-17 DIAGNOSIS — Z1231 Encounter for screening mammogram for malignant neoplasm of breast: Secondary | ICD-10-CM

## 2021-08-25 IMAGING — US US BREAST*L* LIMITED INC AXILLA
1 series · 11 of 11 positions shown · non-contrast
Comparison: Previous exam(s).

CLINICAL DATA: Bilateral screening recall from baseline for
possible masses.



[Series 1: us breast*left* limited inc axilla · 0.07mm/px · 11 of 11 slices shown]
[im 1/11]
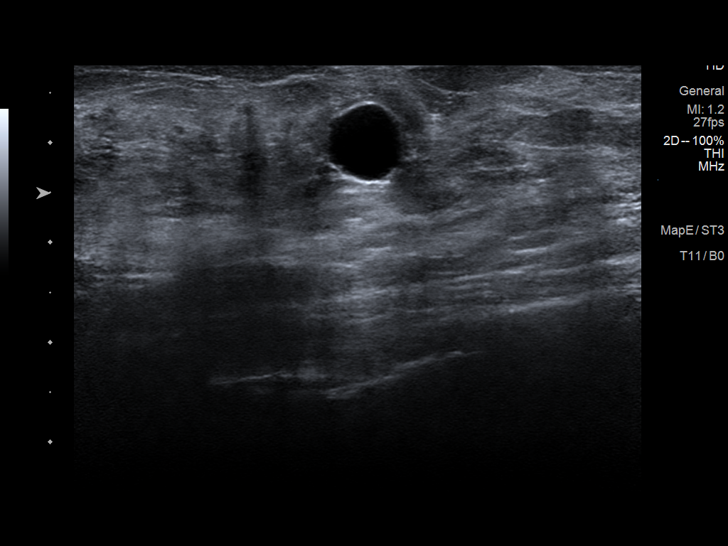
[im 2/11]
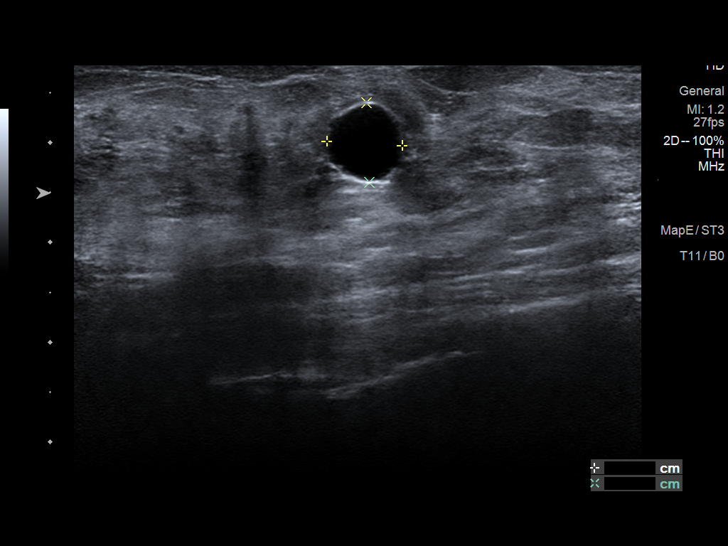
[im 3/11]
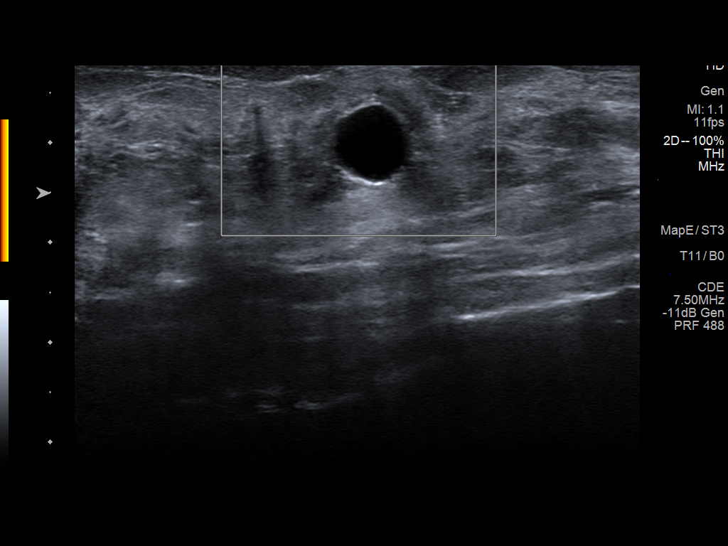
[im 4/11]
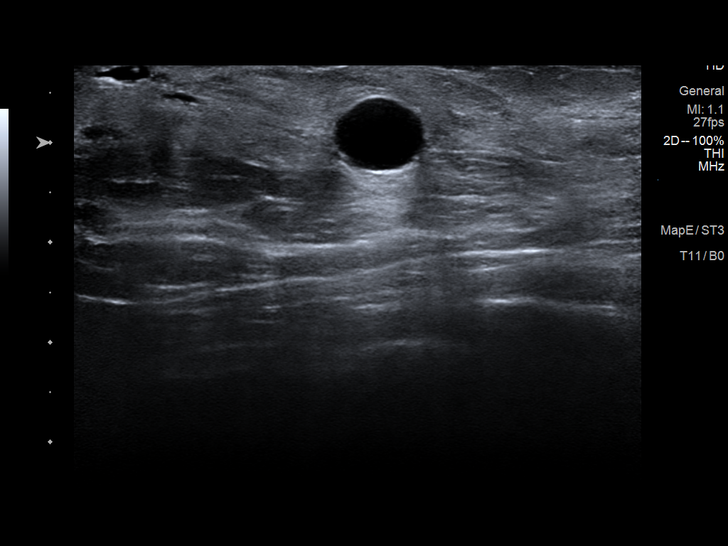
[im 5/11]
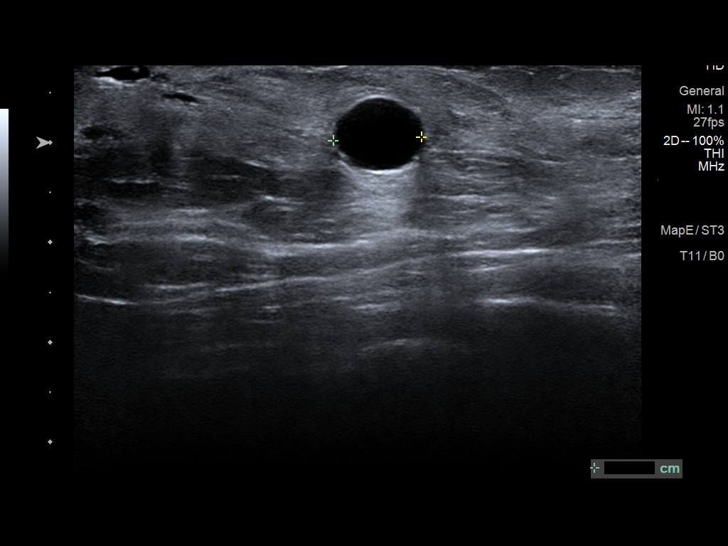
[im 6/11]
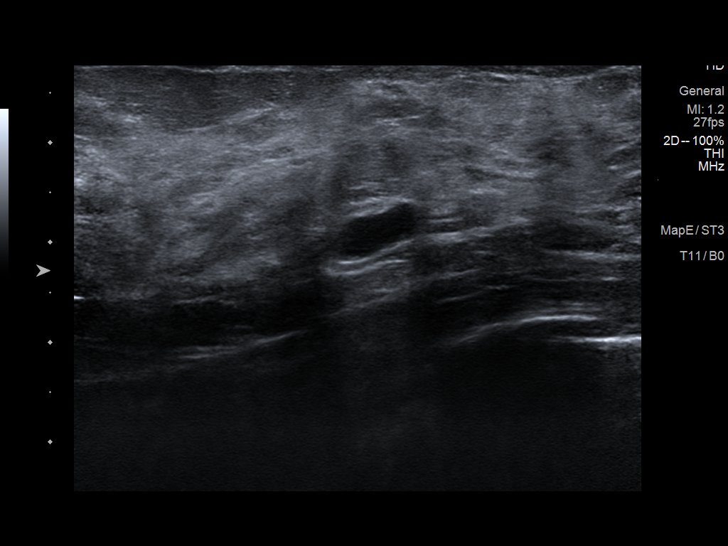
[im 7/11]
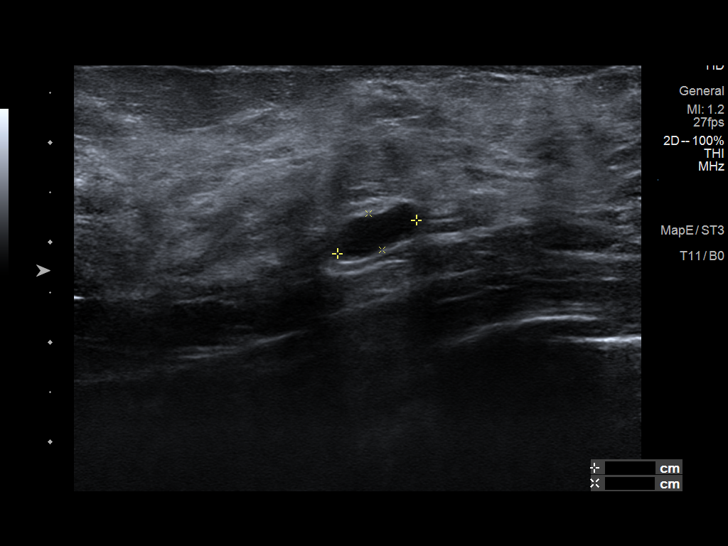
[im 8/11]
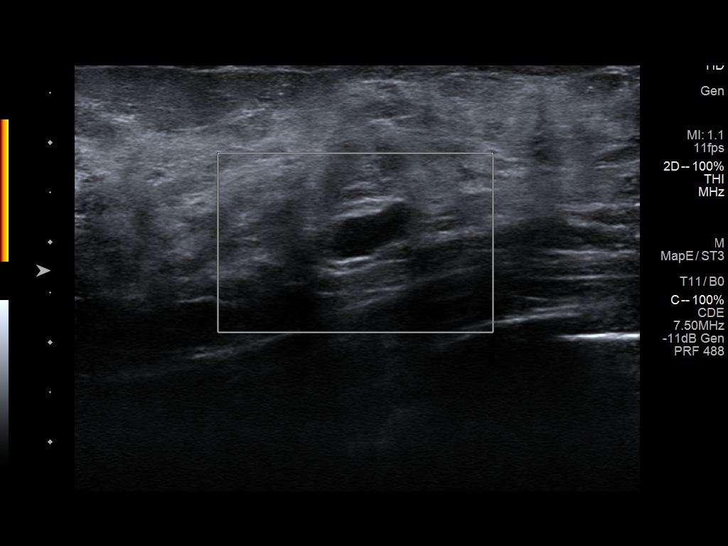
[im 9/11]
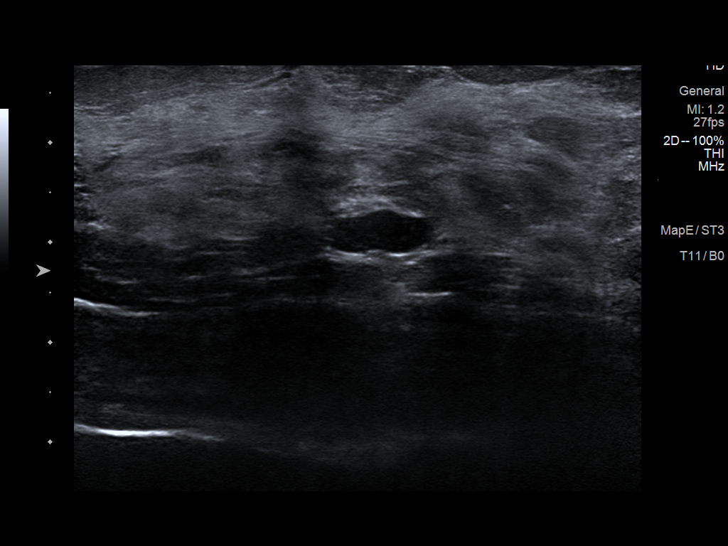
[im 10/11]
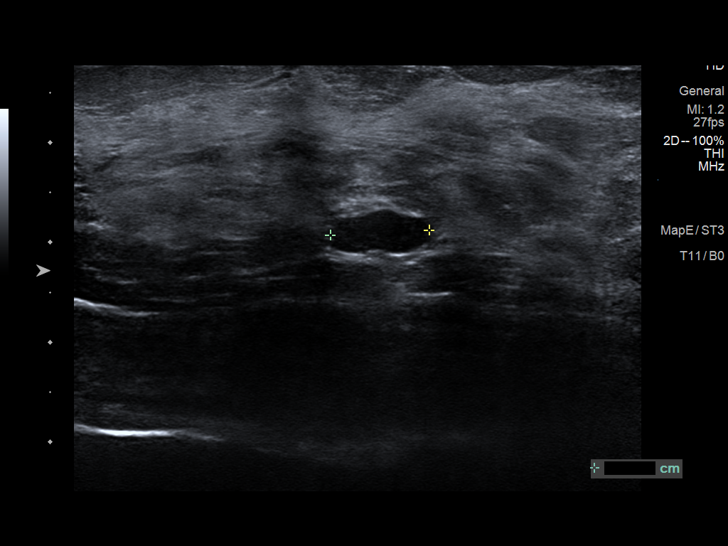
[im 11/11]
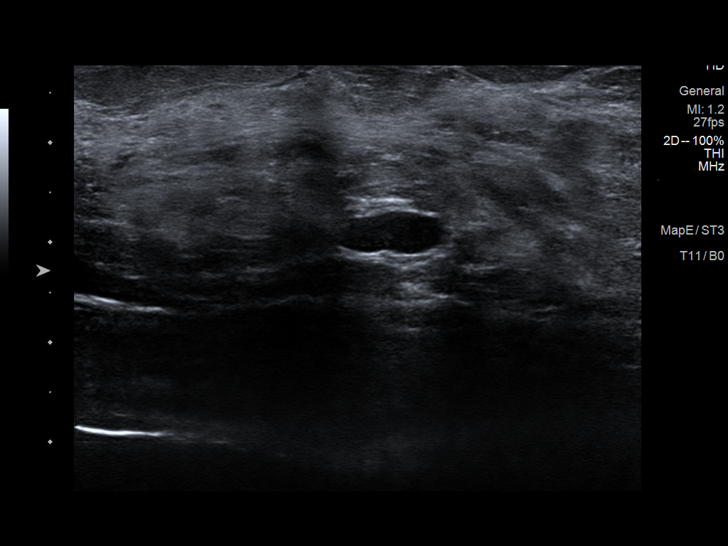

[11 of 11 positions shown; findings below may reference images not displayed]

ACR Breast Density Category c: The breast tissue is heterogeneously
dense, which may obscure small masses.
FINDINGS: Spot compression tomosynthesis images through the lateral right
breast demonstrates a large oval circumscribed mass measuring
cm. In the lateral anterior left breast, there are 2 adjacent oval
circumscribed masses each measuring approximately 1.3 cm.

Ultrasound targeted to the right breast at 9 o'clock, 4 cm from the
nipple demonstrates an anechoic oval circumscribed mass measuring
2.4 x 1.0 x 1.5 cm.

In the left breast at 3 o'clock, 1 cm from the nipple, there are 2
adjacent anechoic oval circumscribed masses. One measuring 0.9 x
x 0.8 cm, and the other measuring 0.0 x 0.4 x 0.9 cm.
IMPRESSION: Benign cysts in the bilateral breasts.

RECOMMENDATION:
Screening mammogram in one year.(Code:Z6-4-0AB)

I have discussed the findings and recommendations with the patient.
If applicable, a reminder letter will be sent to the patient
regarding the next appointment.

BI-RADS CATEGORY  2: Benign.

## 2021-09-23 ENCOUNTER — Encounter: Payer: Medicaid Other | Admitting: Family Medicine

## 2021-12-13 ENCOUNTER — Other Ambulatory Visit (HOSPITAL_COMMUNITY)
Admission: RE | Admit: 2021-12-13 | Discharge: 2021-12-13 | Disposition: A | Discharge status: Home / Self Care | Payer: Medicaid Other | Source: Ambulatory Visit | Attending: Family Medicine | Admitting: Family Medicine

## 2021-12-13 ENCOUNTER — Ambulatory Visit: Payer: Medicaid Other | Admitting: Family Medicine

## 2021-12-13 ENCOUNTER — Encounter: Payer: Self-pay | Admitting: Family Medicine

## 2021-12-13 VITALS — BP 128/77 | HR 64 | Temp 98.1°F | Resp 16 | Ht 59.0 in | Wt 139.8 lb

## 2021-12-13 DIAGNOSIS — Z1329 Encounter for screening for other suspected endocrine disorder: Secondary | ICD-10-CM | POA: Diagnosis not present

## 2021-12-13 DIAGNOSIS — Z13220 Encounter for screening for lipoid disorders: Secondary | ICD-10-CM | POA: Diagnosis not present

## 2021-12-13 DIAGNOSIS — Z13228 Encounter for screening for other metabolic disorders: Secondary | ICD-10-CM

## 2021-12-13 DIAGNOSIS — Z0000 Encounter for general adult medical examination without abnormal findings: Secondary | ICD-10-CM | POA: Diagnosis not present

## 2021-12-13 DIAGNOSIS — Z113 Encounter for screening for infections with a predominantly sexual mode of transmission: Secondary | ICD-10-CM

## 2021-12-13 DIAGNOSIS — Z130 Encounter for screening for diseases of the blood and blood-forming organs and certain disorders involving the immune mechanism: Secondary | ICD-10-CM

## 2021-12-13 LAB — POCT URINALYSIS DIP (CLINITEK)
Bilirubin, UA: NEGATIVE
Blood, UA: NEGATIVE
Glucose, UA: NEGATIVE mg/dL
Nitrite, UA: NEGATIVE
POC PROTEIN,UA: NEGATIVE
Spec Grav, UA: 1.02 (ref 1.010–1.025)
Urobilinogen, UA: 0.2 E.U./dL
pH, UA: 6.5 (ref 5.0–8.0)

## 2021-12-13 MED ORDER — NITROFURANTOIN MONOHYD MACRO 100 MG PO CAPS: 100.0000 mg | ORAL_CAPSULE | Freq: Two times a day (BID) | ORAL | 0 refills | Status: DC

## 2021-12-14 LAB — LIPID PANEL
Chol/HDL Ratio: 1.8 ratio (ref 0.0–4.4)
Cholesterol, Total: 184 mg/dL (ref 100–199)
HDL: 101 mg/dL (ref >39)
LDL Chol Calc (NIH): 70 mg/dL (ref 0–99)
Triglycerides: 69 mg/dL (ref 0–149)
VLDL Cholesterol Cal: 13 mg/dL (ref 5–40)

## 2021-12-14 LAB — CMP14+EGFR
ALT: 13 IU/L (ref 0–32)
AST: 16 IU/L (ref 0–40)
Albumin/Globulin Ratio: 2.4 — ABNORMAL HIGH (ref 1.2–2.2)
Albumin: 4.7 g/dL (ref 3.9–4.9)
Alkaline Phosphatase: 45 IU/L (ref 44–121)
BUN/Creatinine Ratio: 15 (ref 9–23)
BUN: 11 mg/dL (ref 6–24)
Bilirubin Total: <0.2 mg/dL (ref 0.0–1.2)
CO2: 21 mmol/L (ref 20–29)
Calcium: 9.6 mg/dL (ref 8.7–10.2)
Chloride: 100 mmol/L (ref 96–106)
Creatinine, Ser: 0.75 mg/dL (ref 0.57–1.00)
Globulin, Total: 2 g/dL (ref 1.5–4.5)
Glucose: 77 mg/dL (ref 70–99)
Potassium: 4.4 mmol/L (ref 3.5–5.2)
Sodium: 137 mmol/L (ref 134–144)
Total Protein: 6.7 g/dL (ref 6.0–8.5)
eGFR: 102 mL/min/1.73

## 2021-12-14 LAB — CBC WITH DIFFERENTIAL/PLATELET
Basophils Absolute: 0 10*3/uL (ref 0.0–0.2)
Basos: 0 %
EOS (ABSOLUTE): 0.1 10*3/uL (ref 0.0–0.4)
Eos: 1 %
Hematocrit: 38.1 % (ref 34.0–46.6)
Hemoglobin: 12.9 g/dL (ref 11.1–15.9)
Immature Grans (Abs): 0 10*3/uL (ref 0.0–0.1)
Immature Granulocytes: 0 %
Lymphocytes Absolute: 2.4 10*3/uL (ref 0.7–3.1)
Lymphs: 30 %
MCH: 30.1 pg (ref 26.6–33.0)
MCHC: 33.9 g/dL (ref 31.5–35.7)
MCV: 89 fL (ref 79–97)
Monocytes Absolute: 0.7 10*3/uL (ref 0.1–0.9)
Monocytes: 9 %
Neutrophils Absolute: 4.6 10*3/uL (ref 1.4–7.0)
Neutrophils: 60 %
Platelets: 284 10*3/uL (ref 150–450)
RBC: 4.28 x10E6/uL (ref 3.77–5.28)
RDW: 12.8 % (ref 11.7–15.4)
WBC: 7.8 10*3/uL (ref 3.4–10.8)

## 2021-12-14 LAB — TSH: TSH: 1.05 u[IU]/mL (ref 0.450–4.500)

## 2021-12-14 LAB — HEMOGLOBIN A1C
Est. average glucose Bld gHb Est-mCnc: 114 mg/dL
Hgb A1c MFr Bld: 5.6 % (ref 4.8–5.6)

## 2021-12-16 ENCOUNTER — Encounter: Payer: Self-pay | Admitting: Family Medicine

## 2021-12-16 LAB — CERVICOVAGINAL ANCILLARY ONLY
Bacterial Vaginitis (gardnerella): NEGATIVE
Candida Glabrata: NEGATIVE
Candida Vaginitis: NEGATIVE
Chlamydia: NEGATIVE
Comment: NEGATIVE
Comment: NEGATIVE
Comment: NEGATIVE
Comment: NEGATIVE
Comment: NEGATIVE
Comment: NORMAL
Neisseria Gonorrhea: NEGATIVE
Trichomonas: POSITIVE — AB

## 2021-12-17 ENCOUNTER — Other Ambulatory Visit: Payer: Self-pay | Admitting: Family Medicine

## 2021-12-17 MED ORDER — METRONIDAZOLE 500 MG PO TABS: 500.0000 mg | ORAL_TABLET | Freq: Two times a day (BID) | ORAL | 0 refills | Status: AC

## 2021-12-17 NOTE — Progress Notes (Signed)
Established Patient Office Visit  Subjective    Patient ID: Karina Fritz, female    DOB: Feb 21, 1981  Age: 41 y.o. MRN: [id number]  CC:  Chief Complaint  Patient presents with   Annual Exam   Exposure to STD    HPI Karina Fritz presents for routine annual exam. Patient denies acute complaints or concerns.   Outpatient Encounter Medications as of 12/13/2021  Medication Sig   nitrofurantoin, macrocrystal-monohydrate, (MACROBID) 100 MG capsule Take 1 capsule (100 mg total) by mouth 2 (two) times daily.   cyanocobalamin 100 MCG tablet Take 100 mcg by mouth daily.   famotidine (PEPCID) 20 MG tablet Take 1 tablet (20 mg total) by mouth 2 (two) times daily.   vitamin E 1000 UNIT capsule Take 1,000 Units by mouth daily.   [DISCONTINUED] metroNIDAZOLE (METROGEL) 0.75 % vaginal gel INSERT 1 APPLICATORFUL VAGINALLY TWICE DAILY   No facility-administered encounter medications on file as of 12/13/2021.    No past medical history on file.  Past Surgical History:  Procedure Laterality Date   TUBAL LIGATION  2009    Family History  Problem Relation Age of Onset   Hyperlipidemia Mother    Alcohol abuse Father        Alcoholic cirrhosis was cause of death   Cerebral palsy Son        spastic hemiplegia, just affecting right leg    Social History   Socioeconomic History   Marital status: Single    Spouse name: Not on file   Number of children: 3   Years of education: Not on file   Highest education level: High school graduate  Occupational History   Not on file  Tobacco Use   Smoking status: Every Day    Packs/day: 1.00    Years: 25.00    Total pack years: 25.00    Types: Cigarettes    Start date: 09/28/1992   Smokeless tobacco: Never  Vaping Use   Vaping Use: Never used  Substance and Sexual Activity   Alcohol use: Yes    Comment: occasionally on weekends   Drug use: No   Sexual activity: Yes    Birth control/protection: Surgical  Other Topics Concern   Not on  file  Social History Narrative   Not on file   Social Determinants of Health   Financial Resource Strain: Not on file  Food Insecurity: No Food Insecurity (02/20/2021)   Hunger Vital Sign    Worried About Running Out of Food in the Last Year: Never true    Ran Out of Food in the Last Year: Never true  Transportation Needs: No Transportation Needs (02/20/2021)   PRAPARE - Hydrologist (Medical): No    Lack of Transportation (Non-Medical): No  Physical Activity: Insufficiently Active (08/02/2018)   Exercise Vital Sign    Days of Exercise per Week: 7 days    Minutes of Exercise per Session: 20 min  Stress: Stress Concern Present (08/02/2018)   [place]    Feeling of Stress : Very much  Social Connections: Unknown (08/02/2018)   Social Connection and Isolation Panel [NHANES]    Frequency of Communication with Friends and Family: More than three times a week    Frequency of Social Gatherings with Friends and Family: More than three times a week    Attends Religious Services: Not on file    Active Member of Clubs or Organizations: Not on file  Attends Archivist Meetings: Not on file    Marital Status: Not on file  Intimate Partner Violence: Not At Risk (08/02/2018)   Humiliation, Afraid, Rape, and Kick questionnaire    Fear of Current or Ex-Partner: No    Emotionally Abused: No    Physically Abused: No    Sexually Abused: No    Review of Systems  All other systems reviewed and are negative.       Objective    BP 128/77   Pulse 64   Temp 98.1 F (36.7 C) (Oral)   Resp 16   Ht 4' 11" (1.499 m)   Wt 139 lb 12.8 oz (63.4 kg)   SpO2 98%   BMI 28.24 kg/m   Physical Exam Vitals and nursing note reviewed.  Constitutional:      General: She is not in acute distress. HENT:     Head: Normocephalic and atraumatic.     Right Ear: Tympanic membrane, ear canal and  external ear normal.     Left Ear: Tympanic membrane, ear canal and external ear normal.     Nose: Nose normal.     Mouth/Throat:     Mouth: Mucous membranes are moist.     Pharynx: Oropharynx is clear.  Eyes:     Conjunctiva/sclera: Conjunctivae normal.     Pupils: Pupils are equal, round, and reactive to light.  Neck:     Thyroid: No thyromegaly.  Cardiovascular:     Rate and Rhythm: Normal rate and regular rhythm.     Heart sounds: Normal heart sounds. No murmur heard. Pulmonary:     Effort: Pulmonary effort is normal. No respiratory distress.     Breath sounds: Normal breath sounds.  Abdominal:     General: There is no distension.     Palpations: Abdomen is soft. There is no mass.     Tenderness: There is no abdominal tenderness.  Musculoskeletal:        General: Normal range of motion.     Cervical back: Normal range of motion and neck supple.  Skin:    General: Skin is warm and dry.  Neurological:     General: No focal deficit present.     Mental Status: She is alert and oriented to person, place, and time.  Psychiatric:        Mood and Affect: Mood normal.        Behavior: Behavior normal.         Assessment & Plan:   1. Annual physical exam  - POCT URINALYSIS DIP (CLINITEK) - CMP14+EGFR  2. Screening for deficiency anemia  - CBC with Differential  3. Screening for lipid disorders  - Lipid Panel  4. Screening for endocrine/metabolic/immunity disorders  - Hemoglobin A1c - TSH  5. Screen for STD (sexually transmitted disease)  - Cervicovaginal ancillary only    No follow-ups on file.   Becky Sax, MD

## 2021-12-23 ENCOUNTER — Encounter: Payer: Medicaid Other | Admitting: Family Medicine

## 2022-02-03 ENCOUNTER — Encounter: Payer: Self-pay | Admitting: Family Medicine

## 2022-02-05 ENCOUNTER — Ambulatory Visit: Payer: Medicaid Other | Admitting: Family Medicine

## 2022-02-05 ENCOUNTER — Other Ambulatory Visit (HOSPITAL_COMMUNITY)
Admission: RE | Admit: 2022-02-05 | Discharge: 2022-02-05 | Disposition: A | Discharge status: Home / Self Care | Payer: Medicaid Other | Source: Ambulatory Visit | Attending: Family Medicine | Admitting: Family Medicine

## 2022-02-05 ENCOUNTER — Encounter: Payer: Self-pay | Admitting: Family Medicine

## 2022-02-05 VITALS — BP 113/76 | HR 79 | Temp 98.1°F | Resp 16 | Wt 133.0 lb

## 2022-02-05 DIAGNOSIS — Z113 Encounter for screening for infections with a predominantly sexual mode of transmission: Secondary | ICD-10-CM | POA: Insufficient documentation

## 2022-02-05 NOTE — Progress Notes (Unsigned)
Patient is here for recurring BV. Patient would like to talk with provider about

## 2022-02-06 ENCOUNTER — Other Ambulatory Visit: Payer: Self-pay | Admitting: Family Medicine

## 2022-02-06 ENCOUNTER — Encounter: Payer: Self-pay | Admitting: Family Medicine

## 2022-02-06 LAB — CERVICOVAGINAL ANCILLARY ONLY
Bacterial Vaginitis (gardnerella): POSITIVE — AB
Candida Glabrata: NEGATIVE
Candida Vaginitis: NEGATIVE
Chlamydia: NEGATIVE
Comment: NEGATIVE
Comment: NEGATIVE
Comment: NEGATIVE
Comment: NEGATIVE
Comment: NEGATIVE
Comment: NORMAL
Neisseria Gonorrhea: NEGATIVE
Trichomonas: NEGATIVE

## 2022-02-06 MED ORDER — METRONIDAZOLE 0.75 % VA GEL: 1.0000 | Freq: Two times a day (BID) | VAGINAL | 0 refills | Status: DC

## 2022-02-06 MED ORDER — METRONIDAZOLE 500 MG PO TABS: 500.0000 mg | ORAL_TABLET | Freq: Two times a day (BID) | ORAL | 0 refills | Status: AC

## 2022-02-06 NOTE — Progress Notes (Signed)
Established Patient Office Visit  Subjective    Patient ID: Karina Fritz, female    DOB: 1980/03/31  Age: 41 y.o. MRN: [id number]  CC: No chief complaint on file.   HPI Karina Fritz presents with complaint of recurrent vaginitis. Patient reports that she was treated for trich but is unaware if her partner was treated sufficiently .    Outpatient Encounter Medications as of 02/05/2022  Medication Sig   [DISCONTINUED] cyanocobalamin 100 MCG tablet Take 100 mcg by mouth daily.   [DISCONTINUED] famotidine (PEPCID) 20 MG tablet Take 1 tablet (20 mg total) by mouth 2 (two) times daily.   [DISCONTINUED] nitrofurantoin, macrocrystal-monohydrate, (MACROBID) 100 MG capsule Take 1 capsule (100 mg total) by mouth 2 (two) times daily.   [DISCONTINUED] vitamin E 1000 UNIT capsule Take 1,000 Units by mouth daily.   No facility-administered encounter medications on file as of 02/05/2022.    History reviewed. No pertinent past medical history.  Past Surgical History:  Procedure Laterality Date   TUBAL LIGATION  2009    Family History  Problem Relation Age of Onset   Hyperlipidemia Mother    Alcohol abuse Father        Alcoholic cirrhosis was cause of death   Cerebral palsy Son        spastic hemiplegia, just affecting right leg    Social History   Socioeconomic History   Marital status: Single    Spouse name: Not on file   Number of children: 3   Years of education: Not on file   Highest education level: High school graduate  Occupational History   Not on file  Tobacco Use   Smoking status: Every Day    Packs/day: 1.00    Years: 25.00    Total pack years: 25.00    Types: Cigarettes    Start date: 09/28/1992   Smokeless tobacco: Never  Vaping Use   Vaping Use: Never used  Substance and Sexual Activity   Alcohol use: Yes    Comment: occasionally on weekends   Drug use: No   Sexual activity: Yes    Birth control/protection: Surgical  Other Topics Concern   Not on  file  Social History Narrative   Not on file   Social Determinants of Health   Financial Resource Strain: Not on file  Food Insecurity: No Food Insecurity (02/20/2021)   Hunger Vital Sign    Worried About Running Out of Food in the Last Year: Never true    Ran Out of Food in the Last Year: Never true  Transportation Needs: No Transportation Needs (02/20/2021)   PRAPARE - Administrator, Civil Service (Medical): No    Lack of Transportation (Non-Medical): No  Physical Activity: Insufficiently Active (08/02/2018)   Exercise Vital Sign    Days of Exercise per Week: 7 days    Minutes of Exercise per Session: 20 min  Stress: Stress Concern Present (08/02/2018)   [employer] of Occupational Health - Occupational Stress Questionnaire    Feeling of Stress : Very much  Social Connections: Unknown (08/02/2018)   Social Connection and Isolation Panel [NHANES]    Frequency of Communication with Friends and Family: More than three times a week    Frequency of Social Gatherings with Friends and Family: More than three times a week    Attends Religious Services: Not on file    Active Member of Clubs or Organizations: Not on file    Attends Banker Meetings: Not on  file    Marital Status: Not on file  Intimate Partner Violence: Not At Risk (08/02/2018)   Humiliation, Afraid, Rape, and Kick questionnaire    Fear of Current or Ex-Partner: No    Emotionally Abused: No    Physically Abused: No    Sexually Abused: No    Review of Systems  All other systems reviewed and are negative.       Objective    BP 113/76   Pulse 79   Temp 98.1 F (36.7 C) (Oral)   Resp 16   Wt 133 lb (60.3 kg)   SpO2 99%   BMI 26.86 kg/m   Physical Exam Vitals and nursing note reviewed.  Constitutional:      General: She is not in acute distress. Cardiovascular:     Rate and Rhythm: Normal rate and regular rhythm.  Pulmonary:     Effort: Pulmonary effort is normal.      Breath sounds: Normal breath sounds.  Abdominal:     Palpations: Abdomen is soft.     Tenderness: There is no abdominal tenderness.  Neurological:     General: No focal deficit present.     Mental Status: She is alert and oriented to person, place, and time.         Assessment & Plan:   1. Screening for STDs (sexually transmitted diseases) Awaiting results.  - Cervicovaginal ancillary only    Return if symptoms worsen or fail to improve.   Becky Sax, MD

## 2022-04-07 ENCOUNTER — Other Ambulatory Visit: Payer: Self-pay | Admitting: Family Medicine

## 2022-04-08 ENCOUNTER — Encounter: Payer: Self-pay | Admitting: Family Medicine

## 2022-04-17 ENCOUNTER — Other Ambulatory Visit (HOSPITAL_COMMUNITY)
Admission: RE | Admit: 2022-04-17 | Discharge: 2022-04-17 | Disposition: A | Discharge status: Home / Self Care | Payer: Medicaid Other | Source: Ambulatory Visit | Attending: Family Medicine | Admitting: Family Medicine

## 2022-04-17 ENCOUNTER — Ambulatory Visit (INDEPENDENT_AMBULATORY_CARE_PROVIDER_SITE_OTHER): Payer: Medicaid Other | Admitting: Family Medicine

## 2022-04-17 ENCOUNTER — Encounter: Payer: Self-pay | Admitting: Family Medicine

## 2022-04-17 VITALS — BP 96/57 | HR 86 | Ht 59.0 in | Wt 138.4 lb

## 2022-04-17 DIAGNOSIS — Z113 Encounter for screening for infections with a predominantly sexual mode of transmission: Secondary | ICD-10-CM | POA: Diagnosis not present

## 2022-04-17 MED ORDER — METRONIDAZOLE 0.75 % VA GEL: 1.0000 | Freq: Two times a day (BID) | VAGINAL | 0 refills | Status: DC

## 2022-04-17 NOTE — Progress Notes (Signed)
Vaginal discharge, which is yellowish/white in color with odor. Onset two weeks ago.

## 2022-04-18 ENCOUNTER — Encounter: Payer: Self-pay | Admitting: Family Medicine

## 2022-04-18 LAB — CERVICOVAGINAL ANCILLARY ONLY
Bacterial Vaginitis (gardnerella): POSITIVE — AB
Candida Glabrata: NEGATIVE
Candida Vaginitis: POSITIVE — AB
Chlamydia: NEGATIVE
Comment: NEGATIVE
Comment: NEGATIVE
Comment: NEGATIVE
Comment: NEGATIVE
Comment: NEGATIVE
Comment: NORMAL
Neisseria Gonorrhea: NEGATIVE
Trichomonas: NEGATIVE

## 2022-04-18 NOTE — Progress Notes (Signed)
Established Patient Office Visit  Subjective    Patient ID: Karina Fritz, female    DOB: 1980/10/29  Age: 42 y.o. MRN: [id number]  CC:  Chief Complaint  Patient presents with   Vaginal Discharge    HPI [name] presents for eval for STD. She reports that she believes that she may have been exposed and she is having yellowish vaginal discharge.    Outpatient Encounter Medications as of 04/17/2022  Medication Sig   metroNIDAZOLE (METROGEL) 0.75 % vaginal gel Place 1 Applicatorful vaginally 2 (two) times daily.   [DISCONTINUED] metroNIDAZOLE (METROGEL) 0.75 % vaginal gel Place 1 Applicatorful vaginally 2 (two) times daily. (Patient not taking: Reported on 04/17/2022)   No facility-administered encounter medications on file as of 04/17/2022.    History reviewed. No pertinent past medical history.  Past Surgical History:  Procedure Laterality Date   TUBAL LIGATION  2009    Family History  Problem Relation Age of Onset   Hyperlipidemia Mother    Alcohol abuse Father        Alcoholic cirrhosis was cause of death   Cerebral palsy Son        spastic hemiplegia, just affecting right leg    Social History   Socioeconomic History   Marital status: Single    Spouse name: Not on file   Number of children: 3   Years of education: Not on file   Highest education level: High school graduate  Occupational History   Not on file  Tobacco Use   Smoking status: Every Day    Packs/day: 1.00    Years: 25.00    Total pack years: 25.00    Types: Cigarettes    Start date: 09/28/1992   Smokeless tobacco: Never  Vaping Use   Vaping Use: Never used  Substance and Sexual Activity   Alcohol use: Yes    Comment: occasionally on weekends   Drug use: No   Sexual activity: Yes    Birth control/protection: Surgical  Other Topics Concern   Not on file  Social History Narrative   Not on file   Social Determinants of Health   Financial Resource Strain: Not on file  Food  Insecurity: No Food Insecurity (02/20/2021)   Hunger Vital Sign    Worried About Running Out of Food in the Last Year: Never true    Ran Out of Food in the Last Year: Never true  Transportation Needs: No Transportation Needs (02/20/2021)   PRAPARE - Hydrologist (Medical): No    Lack of Transportation (Non-Medical): No  Physical Activity: Insufficiently Active (08/02/2018)   Exercise Vital Sign    Days of Exercise per Week: 7 days    Minutes of Exercise per Session: 20 min  Stress: Stress Concern Present (08/02/2018)   [place]    Feeling of Stress : Very much  Social Connections: Unknown (08/02/2018)   Social Connection and Isolation Panel [NHANES]    Frequency of Communication with Friends and Family: More than three times a week    Frequency of Social Gatherings with Friends and Family: More than three times a week    Attends Religious Services: Not on file    Active Member of Clubs or Organizations: Not on file    Attends Archivist Meetings: Not on file    Marital Status: Not on file  Intimate Partner Violence: Not At Risk (08/02/2018)   Humiliation, Afraid, Rape, and  Kick questionnaire    Fear of Current or Ex-Partner: No    Emotionally Abused: No    Physically Abused: No    Sexually Abused: No    Review of Systems  Genitourinary:  Negative for dysuria.  All other systems reviewed and are negative.       Objective    BP (!) 96/57   Pulse 86   Ht 4' 11"$  (1.499 m)   Wt 138 lb 6.4 oz (62.8 kg)   LMP 04/08/2022 (Approximate)   SpO2 97%   BMI 27.95 kg/m   Physical Exam Vitals and nursing note reviewed.  Constitutional:      General: She is not in acute distress. Cardiovascular:     Rate and Rhythm: Normal rate and regular rhythm.  Pulmonary:     Effort: Pulmonary effort is normal.     Breath sounds: Normal breath sounds.  Abdominal:     Palpations: Abdomen  is soft.     Tenderness: There is no abdominal tenderness.  Neurological:     General: No focal deficit present.     Mental Status: She is alert and oriented to person, place, and time.         Assessment & Plan:   1. Screening for STDs (sexually transmitted diseases) Vag culture results pending. Metrogel vag refilled.  - Cervicovaginal ancillary only    Return if symptoms worsen or fail to improve.   Becky Sax, MD

## 2022-04-22 ENCOUNTER — Other Ambulatory Visit: Payer: Self-pay | Admitting: Family Medicine

## 2022-04-22 MED ORDER — FLUCONAZOLE 150 MG PO TABS: ORAL_TABLET | ORAL | 0 refills | Status: DC

## 2022-05-06 ENCOUNTER — Other Ambulatory Visit: Payer: Self-pay | Admitting: Family Medicine

## 2022-05-06 DIAGNOSIS — Z1231 Encounter for screening mammogram for malignant neoplasm of breast: Secondary | ICD-10-CM

## 2022-05-19 IMAGING — DX DG CHEST 2V
2 series · 2 of 2 positions shown · non-contrast
Comparison: None.

CLINICAL DATA: Chest pain

EXAM:
CHEST - 2 VIEW

[chest pa]
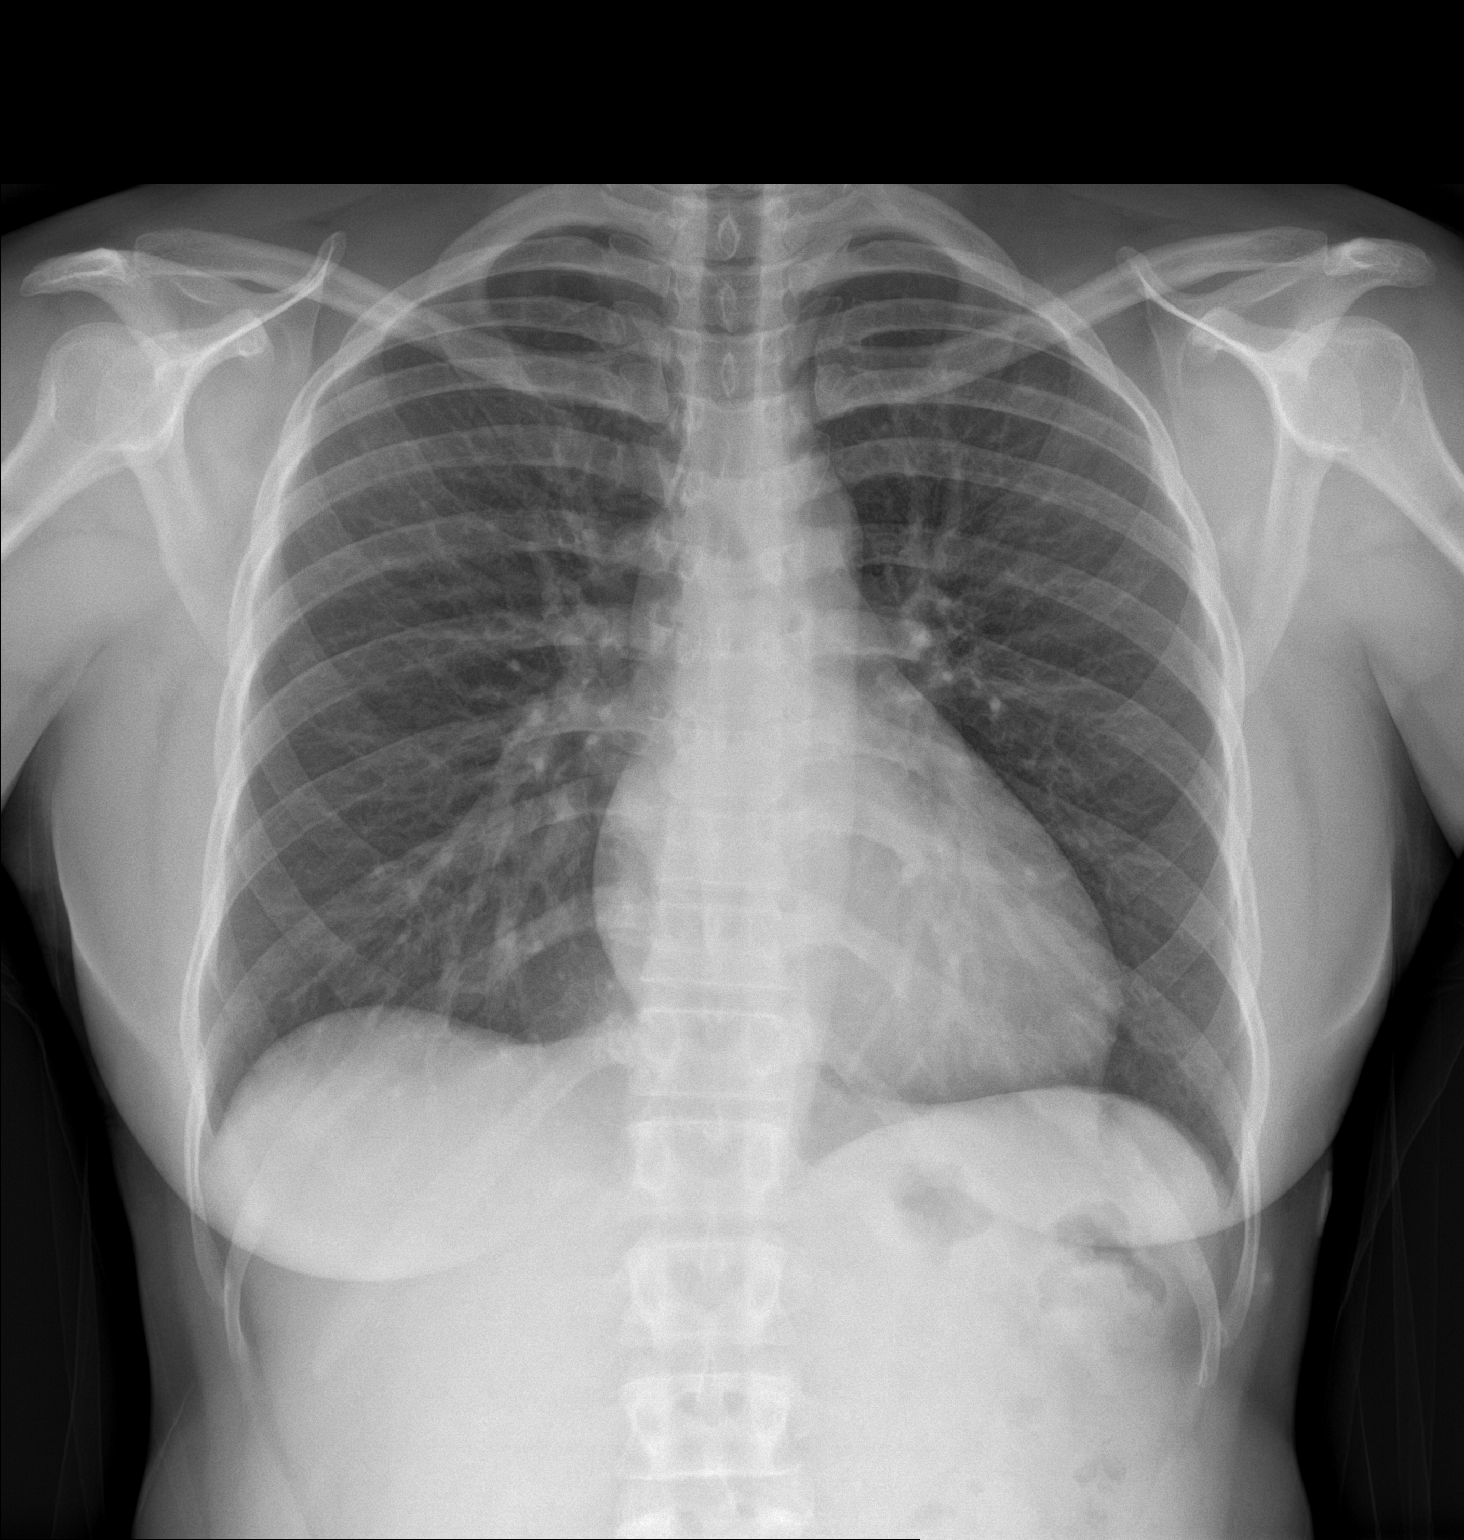

[chest lat]
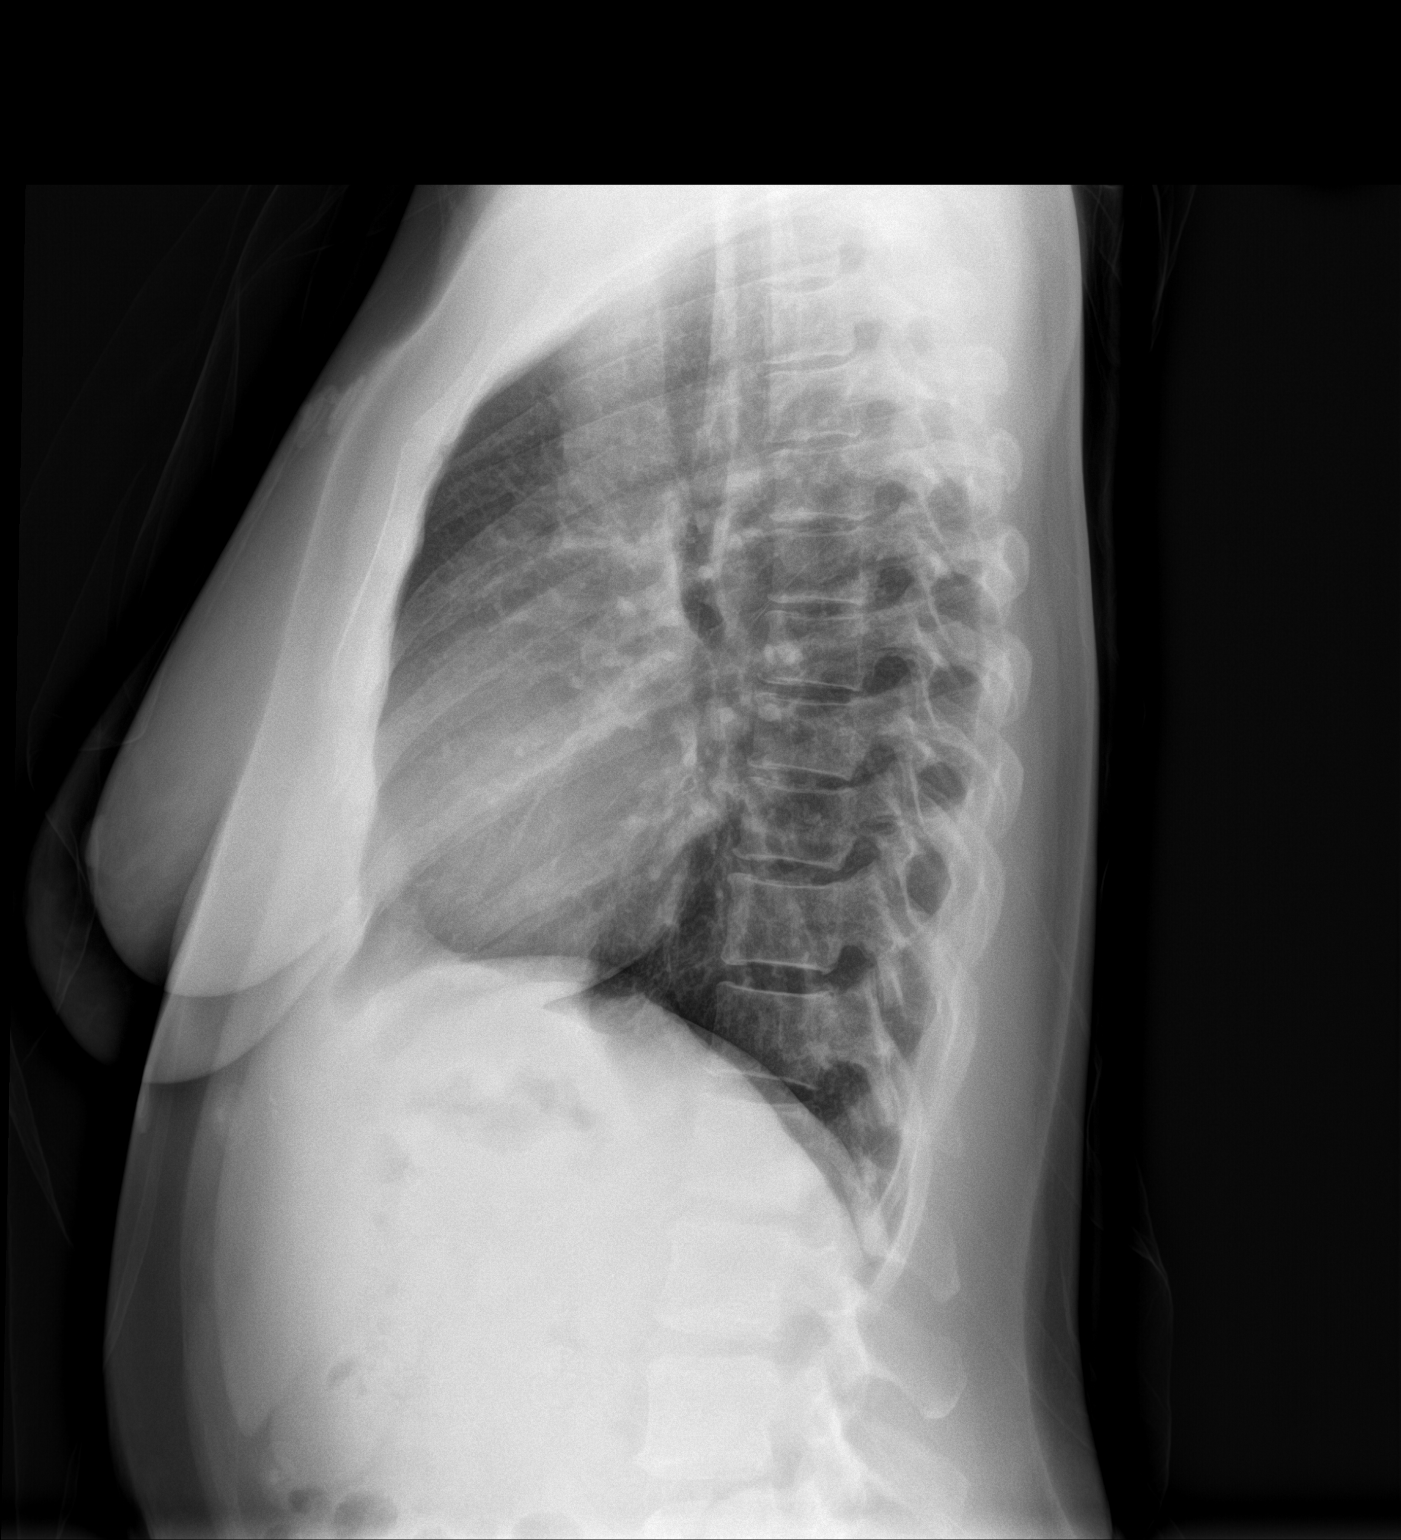

[2 of 2 positions shown; findings below may reference images not displayed]

FINDINGS: Heart size and mediastinal contours are within normal limits. No
suspicious pulmonary opacities identified.

No pleural effusion or pneumothorax visualized.

No acute osseous abnormality appreciated.
IMPRESSION: No acute intrathoracic process identified.

## 2022-06-19 ENCOUNTER — Ambulatory Visit
Admission: RE | Admit: 2022-06-19 | Discharge: 2022-06-19 | Disposition: A | Discharge status: Home / Self Care | Payer: Medicaid Other | Source: Ambulatory Visit | Attending: Family Medicine | Admitting: Family Medicine

## 2022-06-19 DIAGNOSIS — Z1231 Encounter for screening mammogram for malignant neoplasm of breast: Secondary | ICD-10-CM

## 2022-07-03 ENCOUNTER — Other Ambulatory Visit: Payer: Self-pay | Admitting: Family Medicine

## 2022-07-04 ENCOUNTER — Telehealth: Payer: Self-pay | Admitting: Family Medicine

## 2022-07-04 ENCOUNTER — Other Ambulatory Visit: Payer: Self-pay | Admitting: *Deleted

## 2022-07-04 MED ORDER — METRONIDAZOLE 0.75 % VA GEL: 1.0000 | Freq: Two times a day (BID) | VAGINAL | 0 refills | Status: DC

## 2022-07-04 NOTE — Telephone Encounter (Signed)
Medication Refill - Medication: metroNIDAZOLE (METROGEL) 0.75 % vaginal gel   Has the patient contacted their pharmacy? Yes.   Advised that her medication was denied  Preferred Pharmacy (with phone number or street name):  [facility] [phone] - [place], [place] - [address] AT [address] Phone: [phone]  Fax: [phone]     Has the patient been seen for an appointment in the last year OR does the patient have an upcoming appointment? No.  Agent: Please be advised that RX refills may take up to 3 business days. We ask that you follow-up with your pharmacy.  Patient wants to know why her medication was denied

## 2022-07-04 NOTE — Telephone Encounter (Signed)
I have attempted without success to contact this patient by phone to return their call and I left a message on answering machine.

## 2022-08-21 ENCOUNTER — Ambulatory Visit (INDEPENDENT_AMBULATORY_CARE_PROVIDER_SITE_OTHER): Payer: 59 | Admitting: Podiatrist

## 2022-08-21 ENCOUNTER — Encounter: Payer: Self-pay | Admitting: Podiatrist

## 2022-08-21 DIAGNOSIS — M216X9 Other acquired deformities of unspecified foot: Secondary | ICD-10-CM | POA: Diagnosis not present

## 2022-08-21 DIAGNOSIS — L84 Corns and callosities: Secondary | ICD-10-CM | POA: Diagnosis not present

## 2022-08-21 NOTE — Progress Notes (Signed)
   Chief Complaint  Patient presents with   Callouses    Bilateral calluses  causing discomfort      HPI: Patient is 42 y.o. female who presents today for calluses on bilateral feet causing discomfort.  She has tried trimming herself with no improvement in symptoms.  No Known Allergies  Review of systems is negative except as noted in the HPI.  Denies nausea/ vomiting/ fevers/ chills or night sweats.   Denies difficulty breathing, denies calf pain or tenderness  Physical Exam  Patient is awake, alert, and oriented x 3.  In no acute distress.    Vascular status is intact with palpable pedal pulses DP and PT bilateral and capillary refill time less than 3 seconds bilateral.  No edema or erythema noted.   Neurological exam reveals epicritic and protective sensation grossly intact bilateral.   Dermatological exam reveals skin is supple and dry to bilateral feet.  No open lesions present.  Painful calluses noted plantar hallux bilateral and plantar fifth met head bilateral.  Calluses fifth digits bilateral are also seen.  Total number of calluses equals 6  Musculoskeletal exam: Musculature intact with dorsiflexion, plantarflexion, inversion, eversion. Ankle and First MPJ joint range of motion normal. Small bunions noted but are not bothersome.  Adductovarus rotation of the fifth digit bilateral is noted.    Assessment:   ICD-10-CM   1. Prominent metatarsal head, unspecified laterality  M21.6X9     2. Callus  L84        Plan: Discussed treatment options and alternatives.  Recommended trimming down the calluses and this was accomplished today with a 15 blade without complication.  She was given instructions for at home care.  She will call if she needs to be seen again in the future.

## 2022-08-21 NOTE — Patient Instructions (Signed)
Foot Exfoliation Instructions:  1)  Get a tub of warm water (about 10 cups of water or enough water to cover the bottom of your foot)  and place 1/2 cup Epsom salts in the water ( water will be a little cloudy) Soak for 15-20  minutes.  2)  remove your foot from the soak and scrub the feet with Dr. Reynold Bowen epsom salt exfoliant scrub ([facility])   3)  Next use a pumice stone or a pedicure file to scrub down the rough areas of skin on your feet-  do this while your feet are still wet.  You may also prefer to go into the shower and do this while showering.  (Every other day at first , then once weekly for maintenance)  4)  cleanse all of the scrub off of your feet and dry the feet well.    5)  Apply Flexitol Heel balm or O'keefs Healthy feet foot balm after this procedure-- may also apply to feet daily or twice daily.  ** Once a week you may use Dr. Jari Sportsman Ultra exfoliating foot mask or similar ([facility], [facility], etc) prior to soaking as well.      Shopping list:  Epsom salts   Pumice stone or pedicure/ foot file   Flexitol Heel balm or O'keefs healthy feet foot balm    Optional-  Dr. Regino Schultze Ultra exfoliating foot mask

## 2022-09-15 ENCOUNTER — Telehealth: Payer: Self-pay | Admitting: Family Medicine

## 2022-09-15 NOTE — Telephone Encounter (Signed)
Called pt and left vm to call office back to schedule appt request via MyChart. 

## 2022-09-18 ENCOUNTER — Ambulatory Visit (INDEPENDENT_AMBULATORY_CARE_PROVIDER_SITE_OTHER): Payer: 59 | Admitting: Family

## 2022-09-18 ENCOUNTER — Other Ambulatory Visit (HOSPITAL_COMMUNITY)
Admission: RE | Admit: 2022-09-18 | Discharge: 2022-09-18 | Disposition: A | Discharge status: Home / Self Care | Payer: 59 | Source: Ambulatory Visit | Attending: Family | Admitting: Family

## 2022-09-18 VITALS — BP 122/84 | HR 78 | Temp 97.9°F | Ht 59.0 in | Wt 153.2 lb

## 2022-09-18 DIAGNOSIS — N760 Acute vaginitis: Secondary | ICD-10-CM | POA: Diagnosis not present

## 2022-09-18 DIAGNOSIS — B9689 Other specified bacterial agents as the cause of diseases classified elsewhere: Secondary | ICD-10-CM

## 2022-09-18 LAB — POCT URINALYSIS DIP (CLINITEK)
Bilirubin, UA: NEGATIVE
Blood, UA: NEGATIVE
Glucose, UA: NEGATIVE mg/dL
Ketones, POC UA: NEGATIVE mg/dL
Leukocytes, UA: NEGATIVE
Nitrite, UA: NEGATIVE
POC PROTEIN,UA: NEGATIVE
Spec Grav, UA: >=1.03 — AB (ref 1.010–1.025)
Urobilinogen, UA: 0.2 E.U./dL
pH, UA: 5.5 (ref 5.0–8.0)

## 2022-09-18 MED ORDER — METRONIDAZOLE 0.75 % VA GEL: 1.0000 | Freq: Two times a day (BID) | VAGINAL | 2 refills | Status: DC

## 2022-09-18 NOTE — Progress Notes (Signed)
Patient ID: Karina Fritz, female    DOB: 11-Dec-1980  MRN: [id number]  CC: Bacterial Vaginitis   Subjective: Karina Fritz is a 42 y.o. female who presents for bacterial vaginitis.   Her concerns today include:  History of bacterial vaginitis. Reports currently has vaginal odor and vaginal discharge. She denies additional symptoms. No further issues/concerns for discussion today.   There are no problems to display for this patient.    Current Outpatient Medications on File Prior to Visit  Medication Sig Dispense Refill   fluconazole (DIFLUCAN) 150 MG tablet 1 po day 1, 4, and 7. (Patient not taking: Reported on 09/18/2022) 3 tablet 0   BINAXNOW COVID-19 AG HOME TEST KIT TEST AS DIRECTED TODAY (Patient not taking: Reported on 09/18/2022)     No current facility-administered medications on file prior to visit.    No Known Allergies  Social History   Socioeconomic History   Marital status: Single    Spouse name: Not on file   Number of children: 3   Years of education: Not on file   Highest education level: 12th grade  Occupational History   Not on file  Tobacco Use   Smoking status: Every Day    Current packs/day: 1.00    Average packs/day: 1 pack/day for 30.0 years (30.0 ttl pk-yrs)    Types: Cigarettes    Start date: 09/28/1992   Smokeless tobacco: Never  Vaping Use   Vaping status: Never Used  Substance and Sexual Activity   Alcohol use: Yes    Comment: occasionally on weekends   Drug use: No   Sexual activity: Yes    Birth control/protection: Surgical  Other Topics Concern   Not on file  Social History Narrative   Not on file   Social Determinants of Health   Financial Resource Strain: Low Risk  (09/18/2022)   Overall Financial Resource Strain (CARDIA)    Difficulty of Paying Living Expenses: Not hard at all  Food Insecurity: No Food Insecurity (09/18/2022)   Hunger Vital Sign    Worried About Running Out of Food in the Last Year: Never true    Ran  Out of Food in the Last Year: Never true  Transportation Needs: No Transportation Needs (09/18/2022)   PRAPARE - Administrator, Civil Service (Medical): No    Lack of Transportation (Non-Medical): No  Physical Activity: Insufficiently Active (09/18/2022)   Exercise Vital Sign    Days of Exercise per Week: 5 days    Minutes of Exercise per Session: 20 min  Stress: No Stress Concern Present (09/18/2022)   [employer] of Occupational Health - Occupational Stress Questionnaire    Feeling of Stress : Not at all  Social Connections: Unknown (09/18/2022)   Social Connection and Isolation Panel [NHANES]    Frequency of Communication with Friends and Family: More than three times a week    Frequency of Social Gatherings with Friends and Family: Once a week    Attends Religious Services: 1 to 4 times per year    Active Member of [employer] or Organizations: No    Attends Banker Meetings: Not on file    Marital Status: Not on file  Intimate Partner Violence: Not At Risk (08/02/2018)   Humiliation, Afraid, Rape, and Kick questionnaire    Fear of Current or Ex-Partner: No    Emotionally Abused: No    Physically Abused: No    Sexually Abused: No    Family History  Problem Relation  Age of Onset   Hyperlipidemia Mother    Alcohol abuse Father        Alcoholic cirrhosis was cause of death   Cerebral palsy Son        spastic hemiplegia, just affecting right leg    Past Surgical History:  Procedure Laterality Date   TUBAL LIGATION  2009    ROS: Review of Systems Negative except as stated above  PHYSICAL EXAM: BP 122/84   Pulse 78   Temp 97.9 F (36.6 C) (Oral)   Ht 4\' 11"  (1.499 m)   Wt 153 lb 3.2 oz (69.5 kg)   LMP 08/21/2022 (Approximate)   SpO2 93%   BMI 30.94 kg/m    Physical Exam HENT:     Head: Normocephalic and atraumatic.     Nose: Nose normal.     Mouth/Throat:     Mouth: Mucous membranes are moist.     Pharynx: Oropharynx is clear.  Eyes:      Extraocular Movements: Extraocular movements intact.     Conjunctiva/sclera: Conjunctivae normal.     Pupils: Pupils are equal, round, and reactive to light.  Cardiovascular:     Rate and Rhythm: Normal rate and regular rhythm.     Pulses: Normal pulses.     Heart sounds: Normal heart sounds.  Pulmonary:     Effort: Pulmonary effort is normal.     Breath sounds: Normal breath sounds.  Musculoskeletal:     Cervical back: Normal range of motion and neck supple.  Neurological:     General: No focal deficit present.     Mental Status: She is alert and oriented to person, place, and time.  Psychiatric:        Mood and Affect: Mood normal.        Behavior: Behavior normal.     ASSESSMENT AND PLAN: 1. BV (bacterial vaginosis) - Empiric treatment with Metronidazole vaginal gel. Counseled on medication adherence.  - Routine screening.  - Follow-up with primary provider as scheduled.  - metroNIDAZOLE (METROGEL) 0.75 % vaginal gel; Place 1 Applicatorful vaginally 2 (two) times daily.  Dispense: 70 g; Refill: 2 - Cervicovaginal ancillary only - POCT URINALYSIS DIP (CLINITEK); Future    Patient was given the opportunity to ask questions.  Patient verbalized understanding of the plan and was able to repeat key elements of the plan. Patient was given clear instructions to go to Emergency Department or return to medical center if symptoms don't improve, worsen, or new problems develop.The patient verbalized understanding.   Orders Placed This Encounter  Procedures   POCT URINALYSIS DIP (CLINITEK)     Requested Prescriptions   Signed Prescriptions Disp Refills   metroNIDAZOLE (METROGEL) 0.75 % vaginal gel 70 g 2    Sig: Place 1 Applicatorful vaginally 2 (two) times daily.    Return for Follow-Up or next available Georganna Skeans, MD .  Rema Fendt, NP

## 2022-09-19 ENCOUNTER — Other Ambulatory Visit: Payer: Self-pay | Admitting: Family

## 2022-09-19 DIAGNOSIS — B3731 Acute candidiasis of vulva and vagina: Secondary | ICD-10-CM

## 2022-09-19 LAB — CERVICOVAGINAL ANCILLARY ONLY
Bacterial Vaginitis (gardnerella): POSITIVE — AB
Candida Glabrata: NEGATIVE
Candida Vaginitis: POSITIVE — AB
Chlamydia: NEGATIVE
Comment: NEGATIVE
Comment: NEGATIVE
Comment: NEGATIVE
Comment: NEGATIVE
Comment: NEGATIVE
Comment: NORMAL
Neisseria Gonorrhea: NEGATIVE
Trichomonas: NEGATIVE

## 2022-09-19 MED ORDER — FLUCONAZOLE 150 MG PO TABS
150.0000 mg | ORAL_TABLET | ORAL | 0 refills | Status: AC
Start: 2022-09-19 — End: 2022-09-26

## 2022-09-23 ENCOUNTER — Telehealth: Payer: 59 | Admitting: Family

## 2023-05-19 ENCOUNTER — Encounter: Payer: Self-pay | Admitting: Family Medicine

## 2023-05-19 ENCOUNTER — Other Ambulatory Visit: Payer: Self-pay | Admitting: Family Medicine

## 2023-05-19 DIAGNOSIS — Z1231 Encounter for screening mammogram for malignant neoplasm of breast: Secondary | ICD-10-CM

## 2023-05-20 ENCOUNTER — Other Ambulatory Visit (HOSPITAL_COMMUNITY)
Admission: RE | Admit: 2023-05-20 | Discharge: 2023-05-20 | Disposition: A | Discharge status: Home / Self Care | Source: Ambulatory Visit | Attending: Family Medicine | Admitting: Family Medicine

## 2023-05-20 DIAGNOSIS — Z113 Encounter for screening for infections with a predominantly sexual mode of transmission: Secondary | ICD-10-CM | POA: Insufficient documentation

## 2023-05-20 NOTE — Telephone Encounter (Signed)
Please schedule appt for CPE

## 2023-05-21 ENCOUNTER — Ambulatory Visit (INDEPENDENT_AMBULATORY_CARE_PROVIDER_SITE_OTHER): Admitting: Family Medicine

## 2023-05-21 VITALS — BP 125/83 | HR 63 | Temp 98.1°F | Resp 16 | Ht 59.0 in | Wt 151.2 lb

## 2023-05-21 DIAGNOSIS — Z114 Encounter for screening for human immunodeficiency virus [HIV]: Secondary | ICD-10-CM

## 2023-05-21 DIAGNOSIS — Z13220 Encounter for screening for lipoid disorders: Secondary | ICD-10-CM | POA: Diagnosis not present

## 2023-05-21 DIAGNOSIS — Z0000 Encounter for general adult medical examination without abnormal findings: Secondary | ICD-10-CM | POA: Diagnosis not present

## 2023-05-21 DIAGNOSIS — Z113 Encounter for screening for infections with a predominantly sexual mode of transmission: Secondary | ICD-10-CM | POA: Diagnosis present

## 2023-05-21 DIAGNOSIS — Z130 Encounter for screening for diseases of the blood and blood-forming organs and certain disorders involving the immune mechanism: Secondary | ICD-10-CM | POA: Diagnosis not present

## 2023-05-21 NOTE — Telephone Encounter (Signed)
 Patient added to schedule.

## 2023-05-21 NOTE — Progress Notes (Signed)
 Established Patient Office Visit  Subjective    Patient ID: Karina Fritz, female    DOB: Feb 05, 1981  Age: 43 y.o. MRN: [id number]  CC:  Chief Complaint  Patient presents with   Annual Exam    STI testing and HIV Testing    HPI Karina Fritz presents for routine annual exam. Patient denies acute complaints or concerns.   Outpatient Encounter Medications as of 05/21/2023  Medication Sig   BINAXNOW COVID-19 AG HOME TEST KIT TEST AS DIRECTED TODAY (Patient not taking: Reported on 09/18/2022)   metroNIDAZOLE (METROGEL) 0.75 % vaginal gel Place 1 Applicatorful vaginally 2 (two) times daily. (Patient not taking: Reported on 05/21/2023)   No facility-administered encounter medications on file as of 05/21/2023.    History reviewed. No pertinent past medical history.  Past Surgical History:  Procedure Laterality Date   TUBAL LIGATION  2009    Family History  Problem Relation Age of Onset   Hyperlipidemia Mother    Alcohol abuse Father        Alcoholic cirrhosis was cause of death   Cerebral palsy Son        spastic hemiplegia, just affecting right leg    Social History   Socioeconomic History   Marital status: Single    Spouse name: Not on file   Number of children: 3   Years of education: Not on file   Highest education level: 12th grade  Occupational History   Not on file  Tobacco Use   Smoking status: Every Day    Current packs/day: 1.00    Average packs/day: 1 pack/day for 30.6 years (30.6 ttl pk-yrs)    Types: Cigarettes    Start date: 09/28/1992   Smokeless tobacco: Never  Vaping Use   Vaping status: Never Used  Substance and Sexual Activity   Alcohol use: Yes    Comment: occasionally on weekends   Drug use: No   Sexual activity: Yes    Birth control/protection: Surgical  Other Topics Concern   Not on file  Social History Narrative   Not on file   Social Drivers of Health   Financial Resource Strain: Low Risk  (05/21/2023)   Overall Financial  Resource Strain (CARDIA)    Difficulty of Paying Living Expenses: Not hard at all  Food Insecurity: No Food Insecurity (05/21/2023)   Hunger Vital Sign    Worried About Running Out of Food in the Last Year: Never true    Ran Out of Food in the Last Year: Never true  Transportation Needs: No Transportation Needs (05/21/2023)   PRAPARE - Administrator, Civil Service (Medical): No    Lack of Transportation (Non-Medical): No  Physical Activity: Insufficiently Active (05/21/2023)   Exercise Vital Sign    Days of Exercise per Week: 7 days    Minutes of Exercise per Session: 20 min  Stress: No Stress Concern Present (05/21/2023)   [employer] of Occupational Health - Occupational Stress Questionnaire    Feeling of Stress : Not at all  Social Connections: Moderately Isolated (05/21/2023)   Social Connection and Isolation Panel [NHANES]    Frequency of Communication with Friends and Family: More than three times a week    Frequency of Social Gatherings with Friends and Family: Once a week    Attends Religious Services: 1 to 4 times per year    Active Member of [employer] or Organizations: No    Attends Banker Meetings: Not on file    Marital  Status: Never married  Intimate Partner Violence: Not At Risk (08/02/2018)   Humiliation, Afraid, Rape, and Kick questionnaire    Fear of Current or Ex-Partner: No    Emotionally Abused: No    Physically Abused: No    Sexually Abused: No    Review of Systems  All other systems reviewed and are negative.       Objective    BP 125/83   Pulse 63   Temp 98.1 F (36.7 C) (Oral)   Resp 16   Ht 4\' 11"  (1.499 m)   Wt 151 lb 3.2 oz (68.6 kg)   SpO2 98%   BMI 30.54 kg/m   Physical Exam Vitals and nursing note reviewed.  Constitutional:      General: She is not in acute distress. HENT:     Head: Normocephalic and atraumatic.     Right Ear: Tympanic membrane, ear canal and external ear normal.     Left Ear: Tympanic  membrane, ear canal and external ear normal.     Nose: Nose normal.     Mouth/Throat:     Mouth: Mucous membranes are moist.     Pharynx: Oropharynx is clear.  Eyes:     Conjunctiva/sclera: Conjunctivae normal.     Pupils: Pupils are equal, round, and reactive to light.  Neck:     Thyroid: No thyromegaly.  Cardiovascular:     Rate and Rhythm: Normal rate and regular rhythm.     Heart sounds: Normal heart sounds. No murmur heard. Pulmonary:     Effort: Pulmonary effort is normal. No respiratory distress.     Breath sounds: Normal breath sounds.  Abdominal:     General: There is no distension.     Palpations: Abdomen is soft. There is no mass.     Tenderness: There is no abdominal tenderness.  Musculoskeletal:        General: Normal range of motion.     Cervical back: Normal range of motion and neck supple.  Skin:    General: Skin is warm and dry.  Neurological:     General: No focal deficit present.     Mental Status: She is alert and oriented to person, place, and time.  Psychiatric:        Mood and Affect: Mood normal.        Behavior: Behavior normal.         Assessment & Plan:   Annual physical exam -     CMP14+EGFR  Routine screening for STI (sexually transmitted infection) -     Cervicovaginal ancillary only  Screening for deficiency anemia -     CBC with Differential/Platelet  Screening for lipid disorders -     Lipid panel  Screening for HIV (human immunodeficiency virus) -     HIV Antibody (routine testing w rflx)     Return in about 1 year (around 05/20/2024) for physical.   Tommie Raymond, MD

## 2023-05-22 LAB — CMP14+EGFR
ALT: 17 IU/L (ref 0–32)
AST: 23 IU/L (ref 0–40)
Albumin: 4.6 g/dL (ref 3.9–4.9)
Alkaline Phosphatase: 51 IU/L (ref 44–121)
BUN/Creatinine Ratio: 11 (ref 9–23)
BUN: 8 mg/dL (ref 6–24)
Bilirubin Total: 0.2 mg/dL (ref 0.0–1.2)
CO2: 22 mmol/L (ref 20–29)
Calcium: 9.2 mg/dL (ref 8.7–10.2)
Chloride: 103 mmol/L (ref 96–106)
Creatinine, Ser: 0.73 mg/dL (ref 0.57–1.00)
Globulin, Total: 2 g/dL (ref 1.5–4.5)
Glucose: 72 mg/dL (ref 70–99)
Potassium: 4.6 mmol/L (ref 3.5–5.2)
Sodium: 138 mmol/L (ref 134–144)
Total Protein: 6.6 g/dL (ref 6.0–8.5)
eGFR: 105 mL/min/{1.73_m2} (ref >59)

## 2023-05-22 LAB — CERVICOVAGINAL ANCILLARY ONLY
Bacterial Vaginitis (gardnerella): POSITIVE — AB
Candida Glabrata: NEGATIVE
Candida Vaginitis: NEGATIVE
Chlamydia: NEGATIVE
Comment: NEGATIVE
Comment: NEGATIVE
Comment: NEGATIVE
Comment: NEGATIVE
Comment: NEGATIVE
Comment: NORMAL
Neisseria Gonorrhea: NEGATIVE
Trichomonas: NEGATIVE

## 2023-05-22 LAB — CBC WITH DIFFERENTIAL/PLATELET
Basophils Absolute: 0 10*3/uL (ref 0.0–0.2)
Basos: 0 %
EOS (ABSOLUTE): 0.1 10*3/uL (ref 0.0–0.4)
Eos: 1 %
Hematocrit: 36.7 % (ref 34.0–46.6)
Hemoglobin: 12 g/dL (ref 11.1–15.9)
Immature Grans (Abs): 0 10*3/uL (ref 0.0–0.1)
Immature Granulocytes: 1 %
Lymphocytes Absolute: 3 10*3/uL (ref 0.7–3.1)
Lymphs: 38 %
MCH: 29.2 pg (ref 26.6–33.0)
MCHC: 32.7 g/dL (ref 31.5–35.7)
MCV: 89 fL (ref 79–97)
Monocytes Absolute: 0.9 10*3/uL (ref 0.1–0.9)
Monocytes: 12 %
Neutrophils Absolute: 3.9 10*3/uL (ref 1.4–7.0)
Neutrophils: 48 %
Platelets: 301 10*3/uL (ref 150–450)
RBC: 4.11 x10E6/uL (ref 3.77–5.28)
RDW: 12.7 % (ref 11.7–15.4)
WBC: 8 10*3/uL (ref 3.4–10.8)

## 2023-05-22 LAB — LIPID PANEL
Chol/HDL Ratio: 3.1 ratio (ref 0.0–4.4)
Cholesterol, Total: 192 mg/dL (ref 100–199)
HDL: 62 mg/dL (ref >39)
LDL Chol Calc (NIH): 117 mg/dL — ABNORMAL HIGH (ref 0–99)
Triglycerides: 69 mg/dL (ref 0–149)
VLDL Cholesterol Cal: 13 mg/dL (ref 5–40)

## 2023-05-22 LAB — HIV ANTIBODY (ROUTINE TESTING W REFLEX): HIV Screen 4th Generation wRfx: NONREACTIVE

## 2023-05-25 ENCOUNTER — Telehealth: Payer: Self-pay | Admitting: Emergency Medicine

## 2023-05-25 NOTE — Telephone Encounter (Signed)
 Patient returned my call I made her aware that when MD Wilson reads results I will call her back with recommendations.

## 2023-05-25 NOTE — Telephone Encounter (Signed)
 I returned patient call no one answer, so I left a voice message to return my call.

## 2023-06-22 ENCOUNTER — Ambulatory Visit
Admission: RE | Admit: 2023-06-22 | Discharge: 2023-06-22 | Disposition: A | Discharge status: Home / Self Care | Source: Ambulatory Visit | Attending: Family Medicine | Admitting: Family Medicine

## 2023-06-22 DIAGNOSIS — Z1231 Encounter for screening mammogram for malignant neoplasm of breast: Secondary | ICD-10-CM

## 2023-06-25 ENCOUNTER — Encounter: Payer: Self-pay | Admitting: Family Medicine

## 2023-07-01 ENCOUNTER — Other Ambulatory Visit: Payer: Self-pay | Admitting: Family Medicine

## 2023-07-01 DIAGNOSIS — B9689 Other specified bacterial agents as the cause of diseases classified elsewhere: Secondary | ICD-10-CM

## 2023-07-01 MED ORDER — METRONIDAZOLE 0.75 % VA GEL: 1.0000 | Freq: Two times a day (BID) | VAGINAL | 2 refills | Status: DC

## 2024-03-15 ENCOUNTER — Other Ambulatory Visit (HOSPITAL_COMMUNITY)
Admission: RE | Admit: 2024-03-15 | Discharge: 2024-03-15 | Disposition: A | Discharge status: Home / Self Care | Source: Ambulatory Visit | Attending: Physician Assistant | Admitting: Physician Assistant

## 2024-03-15 ENCOUNTER — Encounter: Payer: Self-pay | Admitting: Physician Assistant

## 2024-03-15 ENCOUNTER — Ambulatory Visit: Admitting: Physician Assistant

## 2024-03-15 VITALS — BP 125/57 | HR 77 | Ht 59.0 in | Wt 149.0 lb

## 2024-03-15 DIAGNOSIS — B9689 Other specified bacterial agents as the cause of diseases classified elsewhere: Secondary | ICD-10-CM | POA: Diagnosis not present

## 2024-03-15 DIAGNOSIS — Z113 Encounter for screening for infections with a predominantly sexual mode of transmission: Secondary | ICD-10-CM | POA: Diagnosis present

## 2024-03-15 DIAGNOSIS — N760 Acute vaginitis: Secondary | ICD-10-CM

## 2024-03-15 MED ORDER — METRONIDAZOLE 0.75 % VA GEL: 1.0000 | Freq: Every day | VAGINAL | 0 refills | Status: AC

## 2024-03-15 NOTE — Patient Instructions (Signed)
 VISIT SUMMARY:  During your visit, we discussed your symptoms of thick white vaginal discharge with a fishy odor and irritation, which are consistent with bacterial vaginitis. We also addressed your concerns about potential exposure to sexually transmitted infections.  YOUR PLAN:  -BACTERIAL VAGINITIS: Bacterial vaginitis is an infection caused by an imbalance of bacteria in the vagina. We have prescribed metronidazole  gel for you to use at bedtime for five days.   -SCREENING FOR SEXUALLY TRANSMITTED INFECTIONS: We have performed a vaginal swab to test for gonorrhea, chlamydia, trichomonas, bacterial vaginitis, and yeast infection. We also offered a blood draw for HIV and syphilis testing.   Vaginal Infection (Bacterial Vaginosis): What to Know  Bacterial vaginosis is an infection of the vagina. It happens when the balance of normal germs (bacteria) in the vagina changes. It's common among females ages 29 to 21. If left untreated, it can increase your risk of getting a sexually transmitted infection (STI). If you're pregnant, you need to get treated right away. This infection can cause a baby to be born early or at a low birth weight. What are the causes? This happens when too many harmful germs grow in the vagina. The exact reason why this happens isn't known. You can't get this infection from toilet seats, bedding, swimming pools, or contact with objects around you. What increases the risk? Having new or multiple sexual partners, or unprotected sex. Douching. Using an intrauterine device (IUD). Smoking. Alcohol and drug abuse. Taking certain antibiotics. Being pregnant. You can get a vaginal infection without being sexually active. However, it most often occurs in sexually active females. What are the signs or symptoms? Some females have no symptoms. If you have symptoms, they may include: [place] or white vaginal discharge. It can be watery or foamy. A fish-like smell, especially after  sex or during your menstrual period. Itching in and around the vagina. Burning or pain with peeing. How is this diagnosed? This infection is diagnosed based on: Your medical history. A physical exam of the vagina. Checking a sample of vaginal fluid for harmful bacteria or uncommon cells. How is this treated? This condition is treated with antibiotics. These may be given as: A pill. A cream for your vagina. A medicine that you put into your vagina called a suppository. If the infection comes back, you may need more antibiotics. Follow these instructions at home: Medicines Take your medicines only as told. Take or apply your antibiotics as told. Do not stop using them even if you start to feel better. General instructions If you have a female sexual partner, tell her about the infection. She should see her health care provider. Female partners don't need treatment. Avoid sex until treatment is complete. Drink more fluids as told. Keep the area around your vagina and rectum clean. Wash the area daily with warm water. Wipe yourself from front to back after pooping. If you're breastfeeding, talk to your provider about continuing during treatment. How is this prevented? Self-care Do not douche or use vaginal deodorant sprays. Douching can upset the balance of good and harmful bacteria in the vagina, which can cause an infection to happen again. Wear cotton or cotton-lined underwear. Avoid wearing tight pants or pantyhose, especially in the summer. Safe sex Use condoms correctly and every time you have sex. Use dental dams to protect yourself during oral sex. Limit the number of sexual partners. Get tested for STIs. Your sexual partner should also get tested. Drugs and alcohol Do not smoke, vape, or use  nicotine or tobacco. Do not use drugs. Limit the amount of alcohol you drink because it can lead to risky sexual behavior. Where to find more information To learn more: Go to  tonerpromos.no. Click Health Topics A-Z. Type bacterial vaginosis in the search box. American Sexual Health Association (ASHA): ashasexualhealth.org U.S. Department of Health and Health And Safety Inspector, Office on Women's Health: travellesson.ca Contact a health care provider if: Your symptoms don't get better, even after treatment. You have more discharge or pain when peeing. You have a fever or chills. You have pain in your belly or pelvis. You have pain during sex. You have vaginal bleeding between menstrual periods. This information is not intended to replace advice given to you by your health care provider. Make sure you discuss any questions you have with your health care provider. Document Revised: 08/13/2022 Document Reviewed: 08/13/2022 Elsevier Patient Education  2024 [employer].

## 2024-03-15 NOTE — Progress Notes (Signed)
 "  Established Patient Office Visit  Subjective   Patient ID: Karina Fritz, female    DOB: March 23, 1980  Age: 44 y.o. MRN: [id number]  Chief Complaint  Patient presents with   Exposure to STD    She would like to be tested for STDs  Discussed the use of AI scribe software for clinical note transcription with the patient, who gave verbal consent to proceed.  History of Present Illness    Karina Fritz is a 44 year old female with recurrent bacterial vaginitis who presents with symptoms of a possible bacterial vaginitis.   She has had thick white vaginal discharge with a fishy odor and irritation for the past few days. She denies lesions, itching, burning, nausea, vomiting, fever, or chills.  She has recurrent bacterial vaginitis, with similar episodes every four to five months. She has not used recent antibiotics or any treatment for this episode. She prefers intravaginal metronidazole  gel due to dizziness and feeling sick with the oral form and typically uses the gel at bedtime for five days.    History reviewed. No pertinent past medical history. Social History   Socioeconomic History   Marital status: Single    Spouse name: Not on file   Number of children: 3   Years of education: Not on file   Highest education level: 12th grade  Occupational History   Not on file  Tobacco Use   Smoking status: Every Day    Current packs/day: 1.00    Average packs/day: 1 pack/day for 31.5 years (31.5 ttl pk-yrs)    Types: Cigarettes    Start date: 09/28/1992   Smokeless tobacco: Never  Vaping Use   Vaping status: Never Used  Substance and Sexual Activity   Alcohol use: Yes    Comment: occasionally on weekends   Drug use: No   Sexual activity: Yes    Birth control/protection: Surgical  Other Topics Concern   Not on file  Social History Narrative   Not on file   Social Drivers of Health   Tobacco Use: High Risk (03/15/2024)   Patient History    Smoking Tobacco Use: Every  Day    Smokeless Tobacco Use: Never    Passive Exposure: Not on file  Financial Resource Strain: Low Risk (05/21/2023)   Overall Financial Resource Strain (CARDIA)    Difficulty of Paying Living Expenses: Not hard at all  Food Insecurity: No Food Insecurity (03/15/2024)   Epic    Worried About Radiation Protection Practitioner of Food in the Last Year: Never true    Ran Out of Food in the Last Year: Never true  Transportation Needs: No Transportation Needs (03/15/2024)   Epic    Lack of Transportation (Medical): No    Lack of Transportation (Non-Medical): No  Physical Activity: Insufficiently Active (05/21/2023)   Exercise Vital Sign    Days of Exercise per Week: 7 days    Minutes of Exercise per Session: 20 min  Stress: No Stress Concern Present (05/21/2023)   [employer] of Occupational Health - Occupational Stress Questionnaire    Feeling of Stress : Not at all  Social Connections: Moderately Isolated (05/21/2023)   Social Connection and Isolation Panel    Frequency of Communication with Friends and Family: More than three times a week    Frequency of Social Gatherings with Friends and Family: Once a week    Attends Religious Services: 1 to 4 times per year    Active Member of Clubs or Organizations: No  Attends Banker Meetings: Not on file    Marital Status: Never married  Intimate Partner Violence: Not At Risk (03/15/2024)   Epic    Fear of Current or Ex-Partner: No    Emotionally Abused: No    Physically Abused: No    Sexually Abused: No  Depression (PHQ2-9): Low Risk (03/15/2024)   Depression (PHQ2-9)    PHQ-2 Score: 0  Alcohol Screen: Low Risk (05/21/2023)   Alcohol Screen    Last Alcohol Screening Score (AUDIT): 3  Housing: Low Risk (03/15/2024)   Epic    Unable to Pay for Housing in the Last Year: No    Number of Times Moved in the Last Year: 0    Homeless in the Last Year: No  Utilities: Not At Risk (03/15/2024)   Epic    Threatened with loss of utilities: No  Health  Literacy: Not on file   Family History  Problem Relation Age of Onset   Hyperlipidemia Mother    Alcohol abuse Father        Alcoholic cirrhosis was cause of death   Cerebral palsy Son        spastic hemiplegia, just affecting right leg   Allergies[1]  Review of Systems  Constitutional:  Negative for chills and fever.  HENT: Negative.    Eyes: Negative.   Respiratory:  Negative for shortness of breath.   Cardiovascular:  Negative for chest pain.  Gastrointestinal:  Negative for abdominal pain, nausea and vomiting.  Genitourinary:  Negative for dysuria, frequency and urgency.  Musculoskeletal:  Negative for back pain.  Skin: Negative.   Neurological: Negative.   Endo/Heme/Allergies: Negative.   Psychiatric/Behavioral: Negative.        Objective:     BP (!) 125/57 (BP Location: Left Arm, Patient Position: Sitting)   Pulse 77   Ht 4' 11 (1.499 m)   Wt 149 lb (67.6 kg)   SpO2 100%   BMI 30.09 kg/m  BP Readings from Last 3 Encounters:  03/15/24 (!) 125/57  05/21/23 125/83  09/18/22 122/84   Wt Readings from Last 3 Encounters:  03/15/24 149 lb (67.6 kg)  05/21/23 151 lb 3.2 oz (68.6 kg)  09/18/22 153 lb 3.2 oz (69.5 kg)    Physical Exam Vitals and nursing note reviewed.  Constitutional:      Appearance: Normal appearance.  HENT:     Head: Normocephalic and atraumatic.     Right Ear: External ear normal.     Left Ear: External ear normal.     Nose: Nose normal.     Mouth/Throat:     Mouth: Mucous membranes are moist.     Pharynx: Oropharynx is clear.  Eyes:     Extraocular Movements: Extraocular movements intact.     Conjunctiva/sclera: Conjunctivae normal.     Pupils: Pupils are equal, round, and reactive to light.  Cardiovascular:     Rate and Rhythm: Normal rate and regular rhythm.     Pulses: Normal pulses.     Heart sounds: Normal heart sounds.  Pulmonary:     Effort: Pulmonary effort is normal.     Breath sounds: Normal breath sounds.   Abdominal:     General: Abdomen is flat.     Tenderness: There is no abdominal tenderness. There is no right CVA tenderness or left CVA tenderness.  Musculoskeletal:        General: Normal range of motion.     Cervical back: Normal range of motion and neck supple.  Skin:    General: Skin is warm and dry.  Neurological:     General: No focal deficit present.     Mental Status: She is alert and oriented to person, place, and time.  Psychiatric:        Mood and Affect: Mood normal.        Behavior: Behavior normal.        Thought Content: Thought content normal.        Judgment: Judgment normal.        Assessment & Plan:   Problem List Items Addressed This Visit   None Visit Diagnoses       Bacterial vaginitis    -  Primary   Relevant Medications   metroNIDAZOLE  (METROGEL ) 0.75 % vaginal gel     Recurrent vaginitis       Relevant Orders   Ambulatory referral to Gynecology     Screen for sexually transmitted diseases       Relevant Orders   Cervicovaginal ancillary only   HIV antibody (with reflex)   RPR      1. Bacterial vaginitis (Primary) Trial Metrogel , patient education given on supportive care.  Red flags given for prompt reevaluation - metroNIDAZOLE  (METROGEL ) 0.75 % vaginal gel; Place 1 Applicatorful vaginally at bedtime for 5 days.  Dispense: 70 g; Refill: 0  2. Recurrent vaginitis  - Ambulatory referral to Gynecology  3. Screen for sexually transmitted diseases  - Cervicovaginal ancillary only - HIV antibody (with reflex) - RPR    I have reviewed the patient's medical history (PMH, PSH, Social History, Family History, Medications, and allergies) , and have been updated if relevant. I spent 30 minutes reviewing chart and  face to face time with patient.     Return if symptoms worsen or fail to improve.    Winter Trefz S Mayers, PA-C     [1] No Known Allergies  "

## 2024-03-16 ENCOUNTER — Ambulatory Visit: Payer: Self-pay | Admitting: Physician Assistant

## 2024-03-16 LAB — CERVICOVAGINAL ANCILLARY ONLY
Bacterial Vaginitis (gardnerella): POSITIVE — AB
Candida Glabrata: NEGATIVE
Candida Vaginitis: NEGATIVE
Chlamydia: NEGATIVE
Comment: NEGATIVE
Comment: NEGATIVE
Comment: NEGATIVE
Comment: NEGATIVE
Comment: NEGATIVE
Comment: NORMAL
Neisseria Gonorrhea: NEGATIVE
Trichomonas: NEGATIVE

## 2024-03-16 LAB — HIV ANTIBODY (ROUTINE TESTING W REFLEX): HIV Screen 4th Generation wRfx: NONREACTIVE

## 2024-03-16 LAB — SYPHILIS: RPR W/REFLEX TO RPR TITER AND TREPONEMAL ANTIBODIES, TRADITIONAL SCREENING AND DIAGNOSIS ALGORITHM: RPR Ser Ql: NONREACTIVE

## 2024-05-23 ENCOUNTER — Encounter: Payer: Self-pay | Admitting: Family Medicine
